# Patient Record
Sex: Female | Born: 1965
Health system: Southern US, Community
[De-identification: ages and names within clinical notes are randomized; demographics above are authoritative.]

## PROBLEM LIST (undated history)

## (undated) DIAGNOSIS — A159 Respiratory tuberculosis unspecified: Secondary | ICD-10-CM

## (undated) DIAGNOSIS — I10 Essential (primary) hypertension: Secondary | ICD-10-CM

## (undated) HISTORY — DX: Respiratory tuberculosis unspecified: A15.9

## (undated) HISTORY — PX: WISDOM TOOTH EXTRACTION: SHX21

## (undated) HISTORY — PX: SPINE SURGERY: SHX786

## (undated) HISTORY — DX: Essential (primary) hypertension: I10

## (undated) HISTORY — PX: BREAST SURGERY: SHX581

---

## 2015-10-06 DIAGNOSIS — Q7649 Other congenital malformations of spine, not associated with scoliosis: Secondary | ICD-10-CM | POA: Insufficient documentation

## 2015-10-06 DIAGNOSIS — M5416 Radiculopathy, lumbar region: Secondary | ICD-10-CM | POA: Insufficient documentation

## 2015-10-27 DIAGNOSIS — M47816 Spondylosis without myelopathy or radiculopathy, lumbar region: Secondary | ICD-10-CM | POA: Insufficient documentation

## 2016-02-24 DIAGNOSIS — M533 Sacrococcygeal disorders, not elsewhere classified: Secondary | ICD-10-CM | POA: Insufficient documentation

## 2018-09-06 ENCOUNTER — Other Ambulatory Visit: Payer: Self-pay

## 2018-09-06 DIAGNOSIS — Z20822 Contact with and (suspected) exposure to covid-19: Secondary | ICD-10-CM

## 2018-09-07 LAB — NOVEL CORONAVIRUS, NAA: SARS-CoV-2, NAA: NOT DETECTED

## 2019-03-10 ENCOUNTER — Encounter: Payer: Self-pay | Admitting: Family Medicine

## 2019-03-10 ENCOUNTER — Encounter: Payer: Self-pay | Admitting: Gastroenterology

## 2019-03-10 ENCOUNTER — Ambulatory Visit: Payer: 59 | Admitting: Family Medicine

## 2019-03-10 ENCOUNTER — Other Ambulatory Visit: Payer: Self-pay

## 2019-03-10 VITALS — BP 128/87 | HR 84 | Temp 97.3°F | Resp 17 | Ht 61.5 in | Wt 148.8 lb

## 2019-03-10 DIAGNOSIS — N912 Amenorrhea, unspecified: Secondary | ICD-10-CM

## 2019-03-10 DIAGNOSIS — F338 Other recurrent depressive disorders: Secondary | ICD-10-CM

## 2019-03-10 DIAGNOSIS — Z13220 Encounter for screening for lipoid disorders: Secondary | ICD-10-CM | POA: Diagnosis not present

## 2019-03-10 DIAGNOSIS — Z114 Encounter for screening for human immunodeficiency virus [HIV]: Secondary | ICD-10-CM | POA: Diagnosis not present

## 2019-03-10 DIAGNOSIS — Z1211 Encounter for screening for malignant neoplasm of colon: Secondary | ICD-10-CM | POA: Diagnosis not present

## 2019-03-10 DIAGNOSIS — R252 Cramp and spasm: Secondary | ICD-10-CM

## 2019-03-10 DIAGNOSIS — I10 Essential (primary) hypertension: Secondary | ICD-10-CM

## 2019-03-10 DIAGNOSIS — Z136 Encounter for screening for cardiovascular disorders: Secondary | ICD-10-CM

## 2019-03-10 NOTE — Patient Instructions (Addendum)
If you have lab work done today you will be contacted with your lab results within the next 2 weeks.  If you have not heard from [place] then please contact us. The fastest way to get your results is to register for My Chart.   IF you received an x-ray today, you will receive an invoice from [facility]. Please contact [facility] at [phone] with questions or concerns regarding your invoice.   IF you received labwork today, you will receive an invoice from [facility]. Please contact [facility] at [phone] with questions or concerns regarding your invoice.   Our billing staff will not be able to assist you with questions regarding bills from these companies.  You will be contacted with the lab results as soon as they are available. The fastest way to get your results is to activate your My Chart account. Instructions are located on the last page of this paperwork. If you have not heard from [place] regarding the results in 2 weeks, please contact this office.     Colorectal Cancer Screening  Colorectal cancer screening is a group of tests that are used to check for colorectal cancer before symptoms develop. Colorectal refers to the colon and rectum. The colon and rectum are located at the end of the digestive tract and carry bowel movements out of the body. Who should have screening? All adults starting at age 58 until age 8 should have screening. Your health care provider may recommend screening at age 33. You will have tests every 1-10 years, depending on your results and the type of screening test. You may have screening tests starting at an earlier age, or more frequently than other people, if you have any of the following risk factors:  A personal or family history of colorectal cancer or abnormal growths (polyps).  Inflammatory bowel disease, such as ulcerative colitis or Crohn's disease.  A history of having radiation treatment to the abdomen or pelvic area for  cancer.  Colorectal cancer symptoms, such as changes in bowel habits or blood in your stool.  A type of colon cancer syndrome that is passed from parent to child (hereditary), such as: ? Lynch syndrome. ? Familial adenomatous polyposis. ? Turcot syndrome. ? Peutz-Jeghers syndrome. Screening recommendations for adults who are 76-86 years old vary depending on health. How is screening done? There are several types of colorectal screening tests. You may have one or more of the following:  Guaiac-based fecal occult blood testing. For this test, a stool (feces) sample is checked for hidden (occult) blood, which could be a sign of colorectal cancer.  Fecal immunochemical test (FIT). For this test, a stool sample is checked for blood, which could be a sign of colorectal cancer.  Stool DNA test. For this test, a stool sample is checked for blood and changes in DNA that could lead to colorectal cancer.  Sigmoidoscopy. During this test, a thin, flexible tube with a camera on the end (sigmoidoscope) is used to examine the rectum and the lower colon.  Colonoscopy. During this test, a long, flexible tube with a camera on the end (colonoscope) is used to examine the entire colon and rectum. With a colonoscopy, it is possible to take a sample of tissue (biopsy) and remove small polyps during the test.  Virtual colonoscopy. Instead of a colonoscope, this type of colonoscopy uses X-rays (CT scan) and computers to produce images of the colon and rectum. What are the benefits of screening? Screening reduces your risk for colorectal  cancer and can help identify cancer at an early stage, when the cancer can be removed or treated more easily. It is common for polyps to form in the lining of the colon, especially as you age. These polyps may be cancerous or become cancerous over time. Screening can identify these polyps. What are the risks of screening? Each screening test may have different risks.  Stool  sample tests have fewer risks than other types of screening tests. However, you may need more tests to confirm results from a stool sample test.  Screening tests that involve X-rays expose you to low levels of radiation, which may slightly increase your cancer risk. The benefit of detecting cancer outweighs the slight increase in risk.  Screening tests such as sigmoidoscopy and colonoscopy may place you at risk for bleeding, intestinal damage, infection, or a reaction to medicines given during the exam. Talk with your health care provider to understand your risk for colorectal cancer and to make a screening plan that is right for you. Questions to ask your health care provider  When should I start colorectal cancer screening?  What is my risk for colorectal cancer?  How often do I need screening?  Which screening tests do I need?  How do I get my test results?  What do my results mean? Where to find more information Learn more about colorectal cancer screening from:  The [employer]: www.cancer.org  The [employer]: www.cancer.gov Summary  Colorectal cancer screening is a group of tests used to check for colorectal cancer before symptoms develop.  Screening reduces your risk for colorectal cancer and can help identify cancer at an early stage, when the cancer can be removed or treated more easily.  All adults starting at age 62 until age 14 should have screening. Your health care provider may recommend screening at age 35.  You may have screening tests starting at an earlier age, or more frequently than other people, if you have certain risk factors.  Talk with your health care provider to understand your risk for colorectal cancer and to make a screening plan that is right for you. This information is not intended to replace advice given to you by your health care provider. Make sure you discuss any questions you have with your health care  provider. Document Revised: 04/30/2018 Document Reviewed: 10/10/2016 Elsevier Patient Education  [place].

## 2019-03-10 NOTE — Progress Notes (Signed)
Established Patient Office Visit  Subjective:  Patient ID: Tracey Church, female    DOB: 22-Apr-1965  Age: 53 y.o. MRN: [id number]  CC:  Chief Complaint  Patient presents with  . New Patient (Initial Visit)    establish care.  Pt has concerns about weight/height ratio and she is fine in the summer but the winter months she can't control her weight and eating.  Pt requesting referral for colonoscopy, pt has gyn and mamm scheduled.  Pt on amlodipine 10 mg for bp.  Pt was on depo but discontinued due to bp, last lmp in 2019-2017 per pt.  No refill needed on medication at this moment    HPI Tracey Church presents for   Seasonal Affective Disorder Patient reports that in the summer time she is active and can control her weight She states that she suffers from seasonal affective disorder She reports that she is find in the summer by the winter she feels sad Depression screen [facility] 2/9 03/10/2019  Decreased Interest 0  Down, Depressed, Hopeless 0  PHQ - 2 Score 0     Colon Cancer Screening She has never had a colonoscopy she denies blood in his stool, unexpected weight loss or pain with defecation No rectal itching she does not smoke she does not have a family history of colon cancer  Hypertension  She was previously on Depo Provera She states that she stopped the Depo because of her blood pressure and her last lmp in 2019. She reports that she avoid extra salt as well Since switching contraception she still needed meds for hypertension  Her bp is well controlled with the Amlodipine She denies chest pain, sob, no lower extremity edema    Past Medical History:  Diagnosis Date  . Hypertension     Past Surgical History:  Procedure Laterality Date  . BREAST SURGERY    . SPINE SURGERY      Family History  Problem Relation Age of Onset  . Hypertension Mother   . Hypertension Father   . Cancer Brother     Social History   Socioeconomic History  . Marital status:  Unknown    Spouse name: Not on file  . Number of children: Not on file  . Years of education: Not on file  . Highest education level: Not on file  Occupational History  . Not on file  Tobacco Use  . Smoking status: Never Smoker  . Smokeless tobacco: Never Used  Substance and Sexual Activity  . Alcohol use: Never  . Drug use: Never  . Sexual activity: Not on file  Other Topics Concern  . Not on file  Social History Narrative  . Not on file   Social Determinants of Health   Financial Resource Strain:   . Difficulty of Paying Living Expenses: Not on file  Food Insecurity:   . Worried About Charity fundraiser in the Last Year: Not on file  . Ran Out of Food in the Last Year: Not on file  Transportation Needs:   . Lack of Transportation (Medical): Not on file  . Lack of Transportation (Non-Medical): Not on file  Physical Activity:   . Days of Exercise per Week: Not on file  . Minutes of Exercise per Session: Not on file  Stress:   . Feeling of Stress : Not on file  Social Connections:   . Frequency of Communication with Friends and Family: Not on file  . Frequency of Social Gatherings with Friends and  Family: Not on file  . Attends Religious Services: Not on file  . Active Member of Clubs or Organizations: Not on file  . Attends Archivist Meetings: Not on file  . Marital Status: Not on file  Intimate Partner Violence:   . Fear of Current or Ex-Partner: Not on file  . Emotionally Abused: Not on file  . Physically Abused: Not on file  . Sexually Abused: Not on file    Outpatient Medications Prior to Visit  Medication Sig Dispense Refill  . amLODipine (NORVASC) 10 MG tablet Take 10 mg by mouth daily.     No facility-administered medications prior to visit.    Allergies  Allergen Reactions  . Tramadol Other (See Comments)    Tremors    ROS Review of Systems Review of Systems  Constitutional: Negative for activity change, appetite change, chills and  fever.  HENT: Negative for congestion, nosebleeds, trouble swallowing and voice change.   Respiratory: Negative for cough, shortness of breath and wheezing.   Gastrointestinal: Negative for diarrhea, nausea and vomiting.  Genitourinary: Negative for difficulty urinating, dysuria, flank pain and hematuria.  Musculoskeletal: Negative for back pain, joint swelling and neck pain.  Neurological: Negative for dizziness, speech difficulty, light-headedness and numbness.  See HPI. All other review of systems negative.     Objective:    Physical Exam  BP 128/87 (BP Location: Left Arm, Patient Position: Sitting, Cuff Size: Normal)   Pulse 84   Temp (!) 97.3 F (36.3 C) (Oral)   Resp 17   Ht 5' 1.5" (1.562 m)   Wt 148 lb 12.8 oz (67.5 kg)   LMP  (LMP Unknown) Comment: discontinue depo and no menses since   SpO2 95%   BMI 27.66 kg/m  Wt Readings from Last 3 Encounters:  03/10/19 148 lb 12.8 oz (67.5 kg)   Physical Exam  Constitutional: Oriented to person, place, and time. Appears well-developed and well-nourished.  HENT:  Head: Normocephalic and atraumatic.  Eyes: Conjunctivae and EOM are normal.  Neck: supple, no thyromegaly Cardiovascular: Normal rate, regular rhythm, normal heart sounds and intact distal pulses.  No murmur heard. Pulmonary/Chest: Effort normal and breath sounds normal. No stridor. No respiratory distress. Has no wheezes.  Abdomen: non-distended, normoactive bs, soft, nontender Neurological: Is alert and oriented to person, place, and time.  Skin: Skin is warm. Capillary refill takes less than 2 seconds.  Psychiatric: Has a normal mood and affect. Behavior is normal. Judgment and thought content normal.    Health Maintenance Due  Topic Date Due  . HIV Screening  01/22/1982  . TETANUS/TDAP  01/22/1986  . PAP SMEAR-Modifier  01/23/1988  . MAMMOGRAM  01/22/2017  . COLONOSCOPY  01/22/2017  . INFLUENZA VACCINE  08/23/2018    There are no preventive care  reminders to display for this patient.  No results found for: TSH No results found for: WBC, HGB, HCT, MCV, PLT No results found for: NA, K, CHLORIDE, CO2, GLUCOSE, BUN, CREATININE, BILITOT, ALKPHOS, AST, ALT, PROT, ALBUMIN, CALCIUM, ANIONGAP, EGFR, GFR No results found for: CHOL No results found for: HDL No results found for: LDLCALC No results found for: TRIG No results found for: CHOLHDL No results found for: HGBA1C    Assessment & Plan:   Problem List Items Addressed This Visit    None    Visit Diagnoses    Essential hypertension    -  Primary Patient's blood pressure is at goal of 139/89 or less. Condition is stable. Continue current  medications and treatment plan. I recommend that you exercise for 30-45 minutes 5 days a week. I also recommend a balanced diet with fruits and vegetables every day, lean meats, and little fried foods. The DASH diet (you can find this online) is a good example of this.    Relevant Medications   amLODipine (NORVASC) 10 MG tablet   Other Relevant Orders   CMP14+EGFR   Microalbumin, urine   Lipid panel   Screening for HIV (human immunodeficiency virus)       Relevant Orders   HIV antibody (with reflex)   Screening for colon cancer    - -discussed options for colon cancer screenings, reviewed family history, discussed options for testing.  -pt decided to proceed with colonoscopy    Relevant Orders   Ambulatory referral to Gastroenterology   Amenorrhea    -  Pt had IUD Mirena placed in 10/2014 (see careeverywhere)  However she was perimenopausal before it was placed so will check LH/FSH   Relevant Orders   FSH/LH   Muscle cramps    -  Will check ferritin as well as electrolytes   Relevant Orders   Ferritin   Encounter for lipid screening for cardiovascular disease       Relevant Orders   Lipid panel      No orders of the defined types were placed in this encounter.   Follow-up: No follow-ups on file.    Forrest Moron, MD

## 2019-03-11 LAB — CMP14+EGFR
ALT: 16 IU/L (ref 0–32)
AST: 27 IU/L (ref 0–40)
Albumin/Globulin Ratio: 1 — ABNORMAL LOW (ref 1.2–2.2)
Albumin: 3.9 g/dL (ref 3.8–4.9)
Alkaline Phosphatase: 58 IU/L (ref 39–117)
BUN/Creatinine Ratio: 12 (ref 9–23)
BUN: 9 mg/dL (ref 6–24)
Bilirubin Total: 0.4 mg/dL (ref 0.0–1.2)
CO2: 23 mmol/L (ref 20–29)
Calcium: 9.7 mg/dL (ref 8.7–10.2)
Chloride: 103 mmol/L (ref 96–106)
Creatinine, Ser: 0.78 mg/dL (ref 0.57–1.00)
GFR calc Af Amer: 101 mL/min/{1.73_m2} (ref >59)
GFR calc non Af Amer: 88 mL/min/{1.73_m2} (ref >59)
Globulin, Total: 3.9 g/dL (ref 1.5–4.5)
Glucose: 99 mg/dL (ref 65–99)
Potassium: 4.9 mmol/L (ref 3.5–5.2)
Sodium: 139 mmol/L (ref 134–144)
Total Protein: 7.8 g/dL (ref 6.0–8.5)

## 2019-03-11 LAB — HIV ANTIBODY (ROUTINE TESTING W REFLEX): HIV Screen 4th Generation wRfx: NONREACTIVE

## 2019-03-11 LAB — LIPID PANEL
Chol/HDL Ratio: 2.9 ratio (ref 0.0–4.4)
Cholesterol, Total: 165 mg/dL (ref 100–199)
HDL: 57 mg/dL (ref >39)
LDL Chol Calc (NIH): 91 mg/dL (ref 0–99)
Triglycerides: 95 mg/dL (ref 0–149)
VLDL Cholesterol Cal: 17 mg/dL (ref 5–40)

## 2019-03-11 LAB — MICROALBUMIN, URINE: Microalbumin, Urine: 22.9 ug/mL

## 2019-03-11 LAB — FERRITIN: Ferritin: 156 ng/mL — ABNORMAL HIGH (ref 15–150)

## 2019-03-11 LAB — FSH/LH
FSH: 137 m[IU]/mL
LH: 65.6 m[IU]/mL

## 2019-03-21 DIAGNOSIS — H524 Presbyopia: Secondary | ICD-10-CM | POA: Diagnosis not present

## 2019-04-01 DIAGNOSIS — Z1239 Encounter for other screening for malignant neoplasm of breast: Secondary | ICD-10-CM | POA: Diagnosis not present

## 2019-04-01 DIAGNOSIS — Z1231 Encounter for screening mammogram for malignant neoplasm of breast: Secondary | ICD-10-CM | POA: Diagnosis not present

## 2019-04-01 DIAGNOSIS — E049 Nontoxic goiter, unspecified: Secondary | ICD-10-CM | POA: Diagnosis not present

## 2019-04-01 DIAGNOSIS — N760 Acute vaginitis: Secondary | ICD-10-CM | POA: Diagnosis not present

## 2019-04-01 DIAGNOSIS — Z13228 Encounter for screening for other metabolic disorders: Secondary | ICD-10-CM | POA: Diagnosis not present

## 2019-04-01 DIAGNOSIS — Z124 Encounter for screening for malignant neoplasm of cervix: Secondary | ICD-10-CM | POA: Diagnosis not present

## 2019-04-01 DIAGNOSIS — Z975 Presence of (intrauterine) contraceptive device: Secondary | ICD-10-CM | POA: Diagnosis not present

## 2019-04-01 DIAGNOSIS — Z01419 Encounter for gynecological examination (general) (routine) without abnormal findings: Secondary | ICD-10-CM | POA: Diagnosis not present

## 2019-04-01 DIAGNOSIS — R87810 Cervical high risk human papillomavirus (HPV) DNA test positive: Secondary | ICD-10-CM | POA: Diagnosis not present

## 2019-04-01 MED FILL — TINIDAZOLE 500 MG TABS: 500 | 2 days supply | Qty: 8 | Fill #0

## 2019-04-02 MED FILL — VIT D3-50 50,000 UNITS CAPS: 1.25 MG | 56 days supply | Qty: 8 | Fill #0

## 2019-04-15 ENCOUNTER — Other Ambulatory Visit: Payer: Self-pay | Admitting: Obstetrics and Gynecology

## 2019-04-15 DIAGNOSIS — E049 Nontoxic goiter, unspecified: Secondary | ICD-10-CM

## 2019-04-17 ENCOUNTER — Ambulatory Visit (AMBULATORY_SURGERY_CENTER): Payer: Self-pay

## 2019-04-17 ENCOUNTER — Other Ambulatory Visit: Payer: Self-pay | Admitting: Obstetrics and Gynecology

## 2019-04-17 ENCOUNTER — Other Ambulatory Visit: Payer: Self-pay

## 2019-04-17 VITALS — Temp 97.3°F | Ht 61.5 in | Wt 158.3 lb

## 2019-04-17 DIAGNOSIS — Z01818 Encounter for other preprocedural examination: Secondary | ICD-10-CM

## 2019-04-17 DIAGNOSIS — Z1211 Encounter for screening for malignant neoplasm of colon: Secondary | ICD-10-CM

## 2019-04-17 MED ORDER — NA SULFATE-K SULFATE-MG SULF 17.5-3.13-1.6 GM/177ML PO SOLN: 1.0000 | Freq: Once | ORAL | 0 refills | Status: AC

## 2019-04-17 MED FILL — SUPREP BOWEL PREP KIT: 17.5-3.13-1 | 1 days supply | Qty: 354 | Fill #0

## 2019-04-17 NOTE — Progress Notes (Signed)
No allergies to soy or egg Pt is not on blood thinners or diet pills Denies issues with sedation/intubation Denies atrial flutter/fib Denies constipation   Emmi instructions given to pt  Pt is aware of Covid safety and care partner requirements.  

## 2019-04-20 ENCOUNTER — Ambulatory Visit: Payer: Self-pay | Admitting: Family Medicine

## 2019-04-21 ENCOUNTER — Other Ambulatory Visit: Payer: 59

## 2019-04-24 ENCOUNTER — Ambulatory Visit
Admission: RE | Admit: 2019-04-24 | Discharge: 2019-04-24 | Disposition: A | Discharge status: Home / Self Care | Payer: 59 | Source: Ambulatory Visit | Attending: Obstetrics and Gynecology | Admitting: Obstetrics and Gynecology

## 2019-04-24 DIAGNOSIS — E049 Nontoxic goiter, unspecified: Secondary | ICD-10-CM

## 2019-04-29 ENCOUNTER — Ambulatory Visit (INDEPENDENT_AMBULATORY_CARE_PROVIDER_SITE_OTHER): Payer: 59

## 2019-04-29 ENCOUNTER — Other Ambulatory Visit: Payer: Self-pay | Admitting: Gastroenterology

## 2019-04-29 DIAGNOSIS — Z1159 Encounter for screening for other viral diseases: Secondary | ICD-10-CM

## 2019-04-30 LAB — SARS CORONAVIRUS 2 (TAT 6-24 HRS): SARS Coronavirus 2: NEGATIVE

## 2019-05-01 ENCOUNTER — Encounter: Payer: Self-pay | Admitting: Gastroenterology

## 2019-05-01 ENCOUNTER — Ambulatory Visit (AMBULATORY_SURGERY_CENTER): Payer: 59 | Admitting: Gastroenterology

## 2019-05-01 ENCOUNTER — Other Ambulatory Visit: Payer: Self-pay

## 2019-05-01 VITALS — BP 116/77 | HR 68 | Temp 96.6°F | Resp 13 | Ht 61.5 in | Wt 158.3 lb

## 2019-05-01 DIAGNOSIS — Z1211 Encounter for screening for malignant neoplasm of colon: Secondary | ICD-10-CM | POA: Diagnosis not present

## 2019-05-01 DIAGNOSIS — D120 Benign neoplasm of cecum: Secondary | ICD-10-CM

## 2019-05-01 DIAGNOSIS — K635 Polyp of colon: Secondary | ICD-10-CM | POA: Diagnosis not present

## 2019-05-01 DIAGNOSIS — K621 Rectal polyp: Secondary | ICD-10-CM

## 2019-05-01 DIAGNOSIS — D128 Benign neoplasm of rectum: Secondary | ICD-10-CM

## 2019-05-01 MED ORDER — SODIUM CHLORIDE 0.9 % IV SOLN: 500.0000 mL | Freq: Once | INTRAVENOUS | Status: DC

## 2019-05-01 NOTE — Progress Notes (Signed)
To PACU, VSS. Report to [email or web address] 

## 2019-05-01 NOTE — Patient Instructions (Signed)
YOU HAD AN ENDOSCOPIC PROCEDURE TODAY AT THE [facility]:   Refer to the procedure report that was given to you for any specific questions about what was found during the examination.  If the procedure report does not answer your questions, please call your gastroenterologist to clarify.  If you requested that your care partner not be given the details of your procedure findings, then the procedure report has been included in a sealed envelope for you to review at your convenience later.  YOU SHOULD EXPECT: Some feelings of bloating in the abdomen. Passage of more gas than usual.  Walking can help get rid of the air that was put into your GI tract during the procedure and reduce the bloating. If you had a lower endoscopy (such as a colonoscopy or flexible sigmoidoscopy) you may notice spotting of blood in your stool or on the toilet paper. If you underwent a bowel prep for your procedure, you may not have a normal bowel movement for a few days.  Please Note:  You might notice some irritation and congestion in your nose or some drainage.  This is from the oxygen used during your procedure.  There is no need for concern and it should clear up in a day or so.  SYMPTOMS TO REPORT IMMEDIATELY:   Following lower endoscopy (colonoscopy or flexible sigmoidoscopy):  Excessive amounts of blood in the stool  Significant tenderness or worsening of abdominal pains  Swelling of the abdomen that is new, acute  Fever of 100F or higher  For urgent or emergent issues, a gastroenterologist can be reached at any hour by calling [phone]. Do not use MyChart messaging for urgent concerns.    DIET:  We do recommend a small meal at first, but then you may proceed to your regular diet.  Drink plenty of fluids but you should avoid alcoholic beverages for 24 hours.  ACTIVITY:  You should plan to take it easy for the rest of today and you should NOT DRIVE or use heavy machinery until tomorrow (because  of the sedation medicines used during the test).    FOLLOW UP: Our staff will call the number listed on your records 48-72 hours following your procedure to check on you and address any questions or concerns that you may have regarding the information given to you following your procedure. If we do not reach you, we will leave a message.  We will attempt to reach you two times.  During this call, we will ask if you have developed any symptoms of COVID 19. If you develop any symptoms (ie: fever, flu-like symptoms, shortness of breath, cough etc.) before then, please call [phone].  If you test positive for Covid 19 in the 2 weeks post procedure, please call and report this information to us.    If any biopsies were taken you will be contacted by phone or by letter within the next 1-3 weeks.  Please call us at [phone] if you have not heard about the biopsies in 3 weeks.    SIGNATURES/CONFIDENTIALITY: You and/or your care partner have signed paperwork which will be entered into your electronic medical record.  These signatures attest to the fact that that the information above on your After Visit Summary has been reviewed and is understood.  Full responsibility of the confidentiality of this discharge information lies with you and/or your care-partner. 

## 2019-05-01 NOTE — Progress Notes (Signed)
Temp by JB Vitals by CW  Pt's states no medical or surgical changes since previsit or office visit.  

## 2019-05-01 NOTE — Op Note (Signed)
[facility] Patient Name: Tracey Church Procedure Date: 05/01/2019 7:14 AM MRN: [id number] Endoscopist: Mauri Pole , MD Age: 54 Referring MD:  Date of Birth: 02/08/1965 Gender: Female Account #: [id number] Procedure:                Colonoscopy Indications:              Screening for colorectal malignant neoplasm Medicines:                Monitored Anesthesia Care Procedure:                Pre-Anesthesia Assessment:                           - Prior to the procedure, a History and Physical                            was performed, and patient medications and                            allergies were reviewed. The patient's tolerance of                            previous anesthesia was also reviewed. The risks                            and benefits of the procedure and the sedation                            options and risks were discussed with the patient.                            All questions were answered, and informed consent                            was obtained. Prior Anticoagulants: The patient has                            taken no previous anticoagulant or antiplatelet                            agents. ASA Grade Assessment: II - A patient with                            mild systemic disease. After reviewing the risks                            and benefits, the patient was deemed in                            satisfactory condition to undergo the procedure.                           After obtaining informed consent, the colonoscope  was passed under direct vision. Throughout the                            procedure, the patient's blood pressure, pulse, and                            oxygen saturations were monitored continuously. The                            Colonoscope was introduced through the anus and                            advanced to the the cecum, identified by                            appendiceal orifice and  ileocecal valve. The                            colonoscopy was performed without difficulty. The                            patient tolerated the procedure well. The quality                            of the bowel preparation was excellent. The                            ileocecal valve, appendiceal orifice, and rectum                            were photographed. Scope In: 8:10:45 AM Scope Out: 8:26:43 AM Scope Withdrawal Time: 0 hours 11 minutes 38 seconds  Total Procedure Duration: 0 hours 15 minutes 58 seconds  Findings:                 The perianal and digital rectal examinations were                            normal.                           Three sessile polyps were found in the rectum and                            appendiceal orifice. The polyps were 1 to 2 mm in                            size. These polyps were removed with a cold biopsy                            forceps. Resection and retrieval were complete.                           Non-bleeding internal hemorrhoids were found during  retroflexion. The hemorrhoids were small.                           The exam was otherwise without abnormality. Complications:            No immediate complications. Estimated Blood Loss:     Estimated blood loss was minimal. Impression:               - Three 1 to 2 mm polyps in the rectum and at the                            appendiceal orifice, removed with a cold biopsy                            forceps. Resected and retrieved.                           - Non-bleeding internal hemorrhoids.                           - The examination was otherwise normal. Recommendation:           - Patient has a contact number available for                            emergencies. The signs and symptoms of potential                            delayed complications were discussed with the                            patient. Return to normal activities tomorrow.                             Written discharge instructions were provided to the                            patient.                           - Resume previous diet.                           - Continue present medications.                           - Await pathology results.                           - Repeat colonoscopy in 5-10 years for surveillance                            based on pathology results. Mauri Pole, MD 05/01/2019 8:31:45 AM This report has been signed electronically.

## 2019-05-01 NOTE — Progress Notes (Signed)
Called to room to assist during endoscopic procedure.  Patient ID and intended procedure confirmed with present staff. Received instructions for my participation in the procedure from the performing physician.  

## 2019-05-05 ENCOUNTER — Telehealth: Payer: Self-pay

## 2019-05-05 NOTE — Telephone Encounter (Signed)
First post procedure follow up call, no answer 

## 2019-05-05 NOTE — Telephone Encounter (Signed)
No answer, left message to call if having any issues or concerns, B.Tiaunna Buford RN 

## 2019-05-06 DIAGNOSIS — B977 Papillomavirus as the cause of diseases classified elsewhere: Secondary | ICD-10-CM | POA: Diagnosis not present

## 2019-05-06 DIAGNOSIS — Z09 Encounter for follow-up examination after completed treatment for conditions other than malignant neoplasm: Secondary | ICD-10-CM | POA: Diagnosis not present

## 2019-05-12 ENCOUNTER — Encounter: Payer: Self-pay | Admitting: Gastroenterology

## 2019-05-19 ENCOUNTER — Other Ambulatory Visit: Payer: Self-pay

## 2019-05-19 ENCOUNTER — Encounter: Payer: Self-pay | Admitting: Registered Nurse

## 2019-05-19 ENCOUNTER — Ambulatory Visit: Payer: 59 | Admitting: Registered Nurse

## 2019-05-19 VITALS — BP 109/75 | HR 87 | Temp 98.3°F | Resp 16 | Ht 61.5 in | Wt 157.0 lb

## 2019-05-19 DIAGNOSIS — I10 Essential (primary) hypertension: Secondary | ICD-10-CM

## 2019-05-19 DIAGNOSIS — R0600 Dyspnea, unspecified: Secondary | ICD-10-CM | POA: Diagnosis not present

## 2019-05-19 DIAGNOSIS — R635 Abnormal weight gain: Secondary | ICD-10-CM

## 2019-05-19 DIAGNOSIS — R0609 Other forms of dyspnea: Secondary | ICD-10-CM

## 2019-05-19 DIAGNOSIS — M7989 Other specified soft tissue disorders: Secondary | ICD-10-CM | POA: Diagnosis not present

## 2019-05-19 MED ORDER — FUROSEMIDE 20 MG PO TABS: 20.0000 mg | ORAL_TABLET | Freq: Every day | ORAL | 3 refills | Status: DC

## 2019-05-19 MED ORDER — HYDROCHLOROTHIAZIDE 25 MG PO TABS: 25.0000 mg | ORAL_TABLET | Freq: Every day | ORAL | 3 refills | Status: DC

## 2019-05-19 MED ORDER — BUPROPION HCL ER (SR) 100 MG PO TB12: 100.0000 mg | ORAL_TABLET | Freq: Two times a day (BID) | ORAL | 0 refills | Status: DC

## 2019-05-19 MED FILL — buPROPion HCL ER (SR) 100 M: 100 | 90 days supply | Qty: 180 | Fill #0

## 2019-05-19 NOTE — Patient Instructions (Signed)
° ° ° °  If you have lab work done today you will be contacted with your lab results within the next 2 weeks.  If you have not heard from us then please contact us. The fastest way to get your results is to register for My Chart. ° ° °IF you received an x-ray today, you will receive an invoice from [facility]. Please contact [facility] at [phone] with questions or concerns regarding your invoice.  ° °IF you received labwork today, you will receive an invoice from [facility]. Please contact [facility] at [phone] with questions or concerns regarding your invoice.  ° °Our billing staff will not be able to assist you with questions regarding bills from these companies. ° °You will be contacted with the lab results as soon as they are available. The fastest way to get your results is to activate your My Chart account. Instructions are located on the last page of this paperwork. If you have not heard from us regarding the results in 2 weeks, please contact this office. °  ° ° ° °

## 2019-05-19 NOTE — Progress Notes (Signed)
Acute Office Visit  Subjective:    Patient ID: Tracey Church, female    DOB: 1965/08/26, 54 y.o.   MRN: [id number]  Chief Complaint  Patient presents with  . Leg Swelling    patient states from wednesday to saturday she had some ankle swelling and broke out into a rash on legs as well. per patient she used ice but it didnt work so she tried to stay off of it.    HPI Patient is in today for leg swelling and weight gain  Weight gain has been steady for a number of months - but it has been over 20lbs with no clear reason. Pt reports that her activity has been somewhat lessened, but diet has been steady. Feeling fatigued and out of breath on exertion very easily. No other changes to note Has been seen recently by PCP Dr. Nolon Rod and GYN, who both had run labs including CBC, tsh, cmp, a1c, lipid panel, fsh/lh, vit d, and more. No clear result. Has been started on Vit D supplementation with no effect. Legs have been swelling 1-2 weeks. Mild rash appearing. Waxes and wanes, overall improving. Has been on amlodipine for a number of years with no changes.   Past Medical History:  Diagnosis Date  . Hypertension   . Tuberculosis    tx in 1987.  Hx of neg chest x-rays    Past Surgical History:  Procedure Laterality Date  . BREAST SURGERY    . SPINE SURGERY    . WISDOM TOOTH EXTRACTION      Family History  Problem Relation Age of Onset  . Hypertension Mother   . Hypertension Father   . Cancer Brother   . Esophageal cancer Brother   . Colon cancer Neg Hx   . Colon polyps Neg Hx   . Rectal cancer Neg Hx   . Stomach cancer Neg Hx     Social History   Socioeconomic History  . Marital status: Soil scientist    Spouse name: Not on file  . Number of children: Not on file  . Years of education: Not on file  . Highest education level: Not on file  Occupational History  . Not on file  Tobacco Use  . Smoking status: Never Smoker  . Smokeless tobacco: Never Used  Substance and  Sexual Activity  . Alcohol use: Never  . Drug use: Never  . Sexual activity: Not on file  Other Topics Concern  . Not on file  Social History Narrative  . Not on file   Social Determinants of Health   Financial Resource Strain:   . Difficulty of Paying Living Expenses:   Food Insecurity:   . Worried About Charity fundraiser in the Last Year:   . Arboriculturist in the Last Year:   Transportation Needs:   . Film/video editor (Medical):   Marland Kitchen Lack of Transportation (Non-Medical):   Physical Activity:   . Days of Exercise per Week:   . Minutes of Exercise per Session:   Stress:   . Feeling of Stress :   Social Connections:   . Frequency of Communication with Friends and Family:   . Frequency of Social Gatherings with Friends and Family:   . Attends Religious Services:   . Active Member of Clubs or Organizations:   . Attends Archivist Meetings:   Marland Kitchen Marital Status:   Intimate Partner Violence:   . Fear of Current or Ex-Partner:   . Emotionally  Abused:   Marland Kitchen Physically Abused:   . Sexually Abused:     Outpatient Medications Prior to Visit  Medication Sig Dispense Refill  . acetaminophen (TYLENOL) 650 MG CR tablet Take by mouth.    . D3-50 1.25 MG (50000 UT) capsule Take 50,000 Units by mouth once a week.    Marland Kitchen amLODipine (NORVASC) 10 MG tablet Take 10 mg by mouth daily.     No facility-administered medications prior to visit.    Allergies  Allergen Reactions  . Tramadol Other (See Comments)    Tremors    Review of Systems Per hpi , all others negative    Objective:    Physical Exam Vitals and nursing note reviewed.  Constitutional:      General: She is not in acute distress.    Appearance: Normal appearance. She is normal weight. She is not toxic-appearing.  Cardiovascular:     Rate and Rhythm: Normal rate and regular rhythm.     Pulses: Normal pulses.     Heart sounds: Normal heart sounds. No murmur. No friction rub. No gallop.   Pulmonary:      Effort: Pulmonary effort is normal. No respiratory distress.     Breath sounds: Normal breath sounds. No stridor. No wheezing, rhonchi or rales.  Chest:     Chest wall: No tenderness.  Skin:    Capillary Refill: Capillary refill takes less than 2 seconds.  Neurological:     General: No focal deficit present.     Mental Status: She is alert and oriented to person, place, and time. Mental status is at baseline.  Psychiatric:        Mood and Affect: Mood normal.        Behavior: Behavior normal.        Thought Content: Thought content normal.        Judgment: Judgment normal.     BP 109/75   Pulse 87   Temp 98.3 F (36.8 C) (Temporal)   Resp 16   Ht 5' 1.5" (1.562 m)   Wt 157 lb (71.2 kg)   SpO2 97%   BMI 29.18 kg/m  Wt Readings from Last 3 Encounters:  05/19/19 157 lb (71.2 kg)  05/01/19 158 lb 4.8 oz (71.8 kg)  04/17/19 158 lb 4.8 oz (71.8 kg)    There are no preventive care reminders to display for this patient.  There are no preventive care reminders to display for this patient.   No results found for: TSH No results found for: WBC, HGB, HCT, MCV, PLT Lab Results  Component Value Date   NA 139 03/10/2019   K 4.9 03/10/2019   CO2 23 03/10/2019   GLUCOSE 99 03/10/2019   BUN 9 03/10/2019   CREATININE 0.78 03/10/2019   BILITOT 0.4 03/10/2019   ALKPHOS 58 03/10/2019   AST 27 03/10/2019   ALT 16 03/10/2019   PROT 7.8 03/10/2019   ALBUMIN 3.9 03/10/2019   CALCIUM 9.7 03/10/2019   Lab Results  Component Value Date   CHOL 165 03/10/2019   Lab Results  Component Value Date   HDL 57 03/10/2019   Lab Results  Component Value Date   LDLCALC 91 03/10/2019   Lab Results  Component Value Date   TRIG 95 03/10/2019   Lab Results  Component Value Date   CHOLHDL 2.9 03/10/2019   No results found for: HGBA1C     Assessment & Plan:   Problem List Items Addressed This Visit    None  Visit Diagnoses    Essential hypertension    -  Primary   Relevant  Medications   hydrochlorothiazide (HYDRODIURIL) 25 MG tablet   Rapid weight gain       Relevant Medications   buPROPion (WELLBUTRIN SR) 100 MG 12 hr tablet   Other Relevant Orders   Ambulatory referral to Endocrinology   Dyspnea on exertion       Relevant Orders   EKG 12-Lead (Completed)       Meds ordered this encounter  Medications  . hydrochlorothiazide (HYDRODIURIL) 25 MG tablet    Sig: Take 1 tablet (25 mg total) by mouth daily.    Dispense:  90 tablet    Refill:  3    Order Specific Question:   Supervising Provider    Answer:   Delia Chimes A [id number]  . buPROPion (WELLBUTRIN SR) 100 MG 12 hr tablet    Sig: Take 1 tablet (100 mg total) by mouth 2 (two) times daily.    Dispense:  180 tablet    Refill:  0    Order Specific Question:   Supervising Provider    Answer:   Forrest Moron [id number]   PLAN  ekg wnl  Reviewed past labs, wnl  Will refer to endo for further work up  Discussed diet and exercise, potential for referral to medical weight management if needed  Pt experiencing depressed mood as a result of weight, will start bupropion 100mg  12hr po qd and increase to bid after one week.  Lasix 20mg  PO qd prn for leg swelling  Patient encouraged to call clinic with any questions, comments, or concerns.   Maximiano Coss, NP

## 2019-05-19 NOTE — Progress Notes (Signed)
1 

## 2019-05-20 MED FILL — FUROSEMIDE 20 MG TABS: 20 | 30 days supply | Qty: 30 | Fill #0

## 2019-05-26 ENCOUNTER — Encounter: Payer: Self-pay | Admitting: Registered Nurse

## 2019-05-26 ENCOUNTER — Ambulatory Visit: Payer: 59 | Admitting: Registered Nurse

## 2019-05-26 ENCOUNTER — Other Ambulatory Visit: Payer: Self-pay

## 2019-05-26 VITALS — BP 120/87 | HR 84 | Temp 97.8°F | Resp 16 | Ht 61.5 in | Wt 156.6 lb

## 2019-05-26 DIAGNOSIS — L509 Urticaria, unspecified: Secondary | ICD-10-CM | POA: Diagnosis not present

## 2019-05-26 DIAGNOSIS — I10 Essential (primary) hypertension: Secondary | ICD-10-CM

## 2019-05-26 DIAGNOSIS — R609 Edema, unspecified: Secondary | ICD-10-CM

## 2019-05-26 MED ORDER — CHLORTHALIDONE 50 MG PO TABS: 50.0000 mg | ORAL_TABLET | Freq: Every day | ORAL | 0 refills | Status: DC

## 2019-05-26 MED ORDER — PREDNISONE 10 MG (21) PO TBPK: ORAL_TABLET | ORAL | 0 refills | Status: DC

## 2019-05-26 MED ORDER — TRIAMCINOLONE ACETONIDE 0.1 % EX CREA: 1.0000 "application " | TOPICAL_CREAM | Freq: Two times a day (BID) | CUTANEOUS | 0 refills | Status: DC

## 2019-05-26 MED ORDER — AMLODIPINE BESYLATE 10 MG PO TABS: 10.0000 mg | ORAL_TABLET | Freq: Every day | ORAL | 1 refills | Status: AC

## 2019-05-26 MED FILL — TRIAMCINOLONE 0.1% CREAM: 0.1 | 15 days supply | Qty: 30 | Fill #0

## 2019-05-26 MED FILL — CHLORTHALIDONE 50 MG TAB: 50 | 90 days supply | Qty: 90 | Fill #0

## 2019-05-26 MED FILL — AMLODIPINE BESYLATE 10 MG T: 10 | 90 days supply | Qty: 90 | Fill #0

## 2019-05-26 NOTE — Patient Instructions (Signed)
° ° ° °  If you have lab work done today you will be contacted with your lab results within the next 2 weeks.  If you have not heard from us then please contact us. The fastest way to get your results is to register for My Chart. ° ° °IF you received an x-ray today, you will receive an invoice from [facility]. Please contact [facility] at [phone] with questions or concerns regarding your invoice.  ° °IF you received labwork today, you will receive an invoice from [facility]. Please contact [facility] at [phone] with questions or concerns regarding your invoice.  ° °Our billing staff will not be able to assist you with questions regarding bills from these companies. ° °You will be contacted with the lab results as soon as they are available. The fastest way to get your results is to activate your My Chart account. Instructions are located on the last page of this paperwork. If you have not heard from us regarding the results in 2 weeks, please contact this office. °  ° ° ° °

## 2019-05-27 ENCOUNTER — Ambulatory Visit: Payer: 59

## 2019-05-27 MED FILL — predniSONE 10 MG TABS: 10 | 6 days supply | Qty: 21 | Fill #0

## 2019-06-12 DIAGNOSIS — Z30432 Encounter for removal of intrauterine contraceptive device: Secondary | ICD-10-CM | POA: Diagnosis not present

## 2019-06-12 DIAGNOSIS — Z30430 Encounter for insertion of intrauterine contraceptive device: Secondary | ICD-10-CM | POA: Diagnosis not present

## 2019-06-15 ENCOUNTER — Ambulatory Visit: Payer: 59 | Admitting: Endocrinology

## 2019-06-18 DIAGNOSIS — Z30431 Encounter for routine checking of intrauterine contraceptive device: Secondary | ICD-10-CM | POA: Diagnosis not present

## 2019-06-28 ENCOUNTER — Encounter: Payer: Self-pay | Admitting: Registered Nurse

## 2019-06-28 NOTE — Progress Notes (Signed)
Established Patient Office Visit  Subjective:  Patient ID: Tracey Church, female    DOB: December 17, 1965  Age: 54 y.o. MRN: [id number]  CC:  Chief Complaint  Patient presents with  . Follow-up    patient states since the last time she came swelling got worse. Per patient she traveled to [place] on the train and felt her skin itching and bumps on arms and the back of her neck.     HPI Tracey Church presents for follow up   Since last visit swelling has worsened and a disseminated red rash has appeared.   Swelling: still in both legs. Denies further CV symptoms including claudication, palpitations, chest pain, shob, doe, visual changes, and headaches.   Rash: worst on arms and neck. Red, small bumps. No concentration in flexural areas or otherwise. No history of infection or infestation. No contacts with rash. No other changes beside medication.  No other concerns at this time.   Past Medical History:  Diagnosis Date  . Hypertension   . Tuberculosis    tx in 1987.  Hx of neg chest x-rays    Past Surgical History:  Procedure Laterality Date  . BREAST SURGERY    . SPINE SURGERY    . WISDOM TOOTH EXTRACTION      Family History  Problem Relation Age of Onset  . Hypertension Mother   . Hypertension Father   . Cancer Brother   . Esophageal cancer Brother   . Colon cancer Neg Hx   . Colon polyps Neg Hx   . Rectal cancer Neg Hx   . Stomach cancer Neg Hx     Social History   Socioeconomic History  . Marital status: Soil scientist    Spouse name: Not on file  . Number of children: Not on file  . Years of education: Not on file  . Highest education level: Not on file  Occupational History  . Not on file  Tobacco Use  . Smoking status: Never Smoker  . Smokeless tobacco: Never Used  Substance and Sexual Activity  . Alcohol use: Never  . Drug use: Never  . Sexual activity: Not on file  Other Topics Concern  . Not on file  Social History Narrative  . Not on file    Social Determinants of Health   Financial Resource Strain:   . Difficulty of Paying Living Expenses:   Food Insecurity:   . Worried About Charity fundraiser in the Last Year:   . Arboriculturist in the Last Year:   Transportation Needs:   . Film/video editor (Medical):   Marland Kitchen Lack of Transportation (Non-Medical):   Physical Activity:   . Days of Exercise per Week:   . Minutes of Exercise per Session:   Stress:   . Feeling of Stress :   Social Connections:   . Frequency of Communication with Friends and Family:   . Frequency of Social Gatherings with Friends and Family:   . Attends Religious Services:   . Active Member of Clubs or Organizations:   . Attends Archivist Meetings:   Marland Kitchen Marital Status:   Intimate Partner Violence:   . Fear of Current or Ex-Partner:   . Emotionally Abused:   Marland Kitchen Physically Abused:   . Sexually Abused:     Outpatient Medications Prior to Visit  Medication Sig Dispense Refill  . acetaminophen (TYLENOL) 650 MG CR tablet Take by mouth.    Marland Kitchen buPROPion (WELLBUTRIN SR) 100 MG 12  hr tablet Take 1 tablet (100 mg total) by mouth 2 (two) times daily. 180 tablet 0  . D3-50 1.25 MG (50000 UT) capsule Take 50,000 Units by mouth once a week.    . furosemide (LASIX) 20 MG tablet Take 1 tablet (20 mg total) by mouth daily. 30 tablet 3   No facility-administered medications prior to visit.    Allergies  Allergen Reactions  . Tramadol Other (See Comments)    Tremors    ROS Review of Systems Per hpi   Objective:    Physical Exam  Constitutional: She is oriented to person, place, and time. She appears well-developed and well-nourished. No distress.  Cardiovascular: Normal rate, regular rhythm, normal heart sounds and intact distal pulses. Exam reveals no gallop and no friction rub.  No murmur heard. Pulmonary/Chest: Effort normal and breath sounds normal. No respiratory distress. She has no wheezes. She has no rales. She exhibits no tenderness.   Neurological: She is alert and oriented to person, place, and time.  Skin: Skin is warm and dry. Rash noted. She is not diaphoretic. No erythema. No pallor.  Psychiatric: She has a normal mood and affect. Her behavior is normal. Judgment and thought content normal.  Nursing note and vitals reviewed.   BP 120/87   Pulse 84   Temp 97.8 F (36.6 C) (Temporal)   Resp 16   Ht 5' 1.5" (1.562 m)   Wt 156 lb 9.6 oz (71 kg)   SpO2 97%   BMI 29.11 kg/m  Wt Readings from Last 3 Encounters:  05/26/19 156 lb 9.6 oz (71 kg)  05/19/19 157 lb (71.2 kg)  05/01/19 158 lb 4.8 oz (71.8 kg)     Health Maintenance Due  Topic Date Due  . PAP SMEAR-Modifier  Never done    There are no preventive care reminders to display for this patient.  No results found for: TSH No results found for: WBC, HGB, HCT, MCV, PLT Lab Results  Component Value Date   NA 139 03/10/2019   K 4.9 03/10/2019   CO2 23 03/10/2019   GLUCOSE 99 03/10/2019   BUN 9 03/10/2019   CREATININE 0.78 03/10/2019   BILITOT 0.4 03/10/2019   ALKPHOS 58 03/10/2019   AST 27 03/10/2019   ALT 16 03/10/2019   PROT 7.8 03/10/2019   ALBUMIN 3.9 03/10/2019   CALCIUM 9.7 03/10/2019   Lab Results  Component Value Date   CHOL 165 03/10/2019   Lab Results  Component Value Date   HDL 57 03/10/2019   Lab Results  Component Value Date   LDLCALC 91 03/10/2019   Lab Results  Component Value Date   TRIG 95 03/10/2019   Lab Results  Component Value Date   CHOLHDL 2.9 03/10/2019   No results found for: HGBA1C    Assessment & Plan:   Problem List Items Addressed This Visit    None    Visit Diagnoses    Hives    -  Primary   Relevant Medications   predniSONE (STERAPRED UNI-PAK 21 TAB) 10 MG (21) TBPK tablet   triamcinolone cream (KENALOG) 0.1 %   Fluid retention       Relevant Medications   chlorthalidone (HYGROTON) 50 MG tablet   Essential hypertension       Relevant Medications   amLODipine (NORVASC) 10 MG tablet    chlorthalidone (HYGROTON) 50 MG tablet      Meds ordered this encounter  Medications  . amLODipine (NORVASC) 10 MG tablet  Sig: Take 1 tablet (10 mg total) by mouth daily.    Dispense:  90 tablet    Refill:  1  . predniSONE (STERAPRED UNI-PAK 21 TAB) 10 MG (21) TBPK tablet    Sig: Take per package instructions. Do not skip doses. Finish entire pack.    Dispense:  1 each    Refill:  0    Order Specific Question:   Supervising Provider    Answer:   Delia Chimes A [id number]  . chlorthalidone (HYGROTON) 50 MG tablet    Sig: Take 1 tablet (50 mg total) by mouth daily.    Dispense:  90 tablet    Refill:  0    Order Specific Question:   Supervising Provider    Answer:   Delia Chimes A [id number]  . triamcinolone cream (KENALOG) 0.1 %    Sig: Apply 1 application topically 2 (two) times daily.    Dispense:  30 g    Refill:  0    Order Specific Question:   Supervising Provider    Answer:   Forrest Moron [id number]    Follow-up: No follow-ups on file.   PLAN  Continue amlodipine - I do not think this is the angioedema that may come with amlodipine use.   Increase chlorthalidone to 50mg  PO qd  sterapred and topical for rash - appears inflammatory, likely an exposure to an allergen  Patient encouraged to call clinic with any questions, comments, or concerns.  Maximiano Coss, NP

## 2019-06-30 ENCOUNTER — Ambulatory Visit: Payer: 59 | Admitting: Registered Nurse

## 2019-07-23 DIAGNOSIS — M9903 Segmental and somatic dysfunction of lumbar region: Secondary | ICD-10-CM | POA: Diagnosis not present

## 2019-07-23 DIAGNOSIS — M545 Low back pain: Secondary | ICD-10-CM | POA: Diagnosis not present

## 2019-07-23 DIAGNOSIS — M542 Cervicalgia: Secondary | ICD-10-CM | POA: Diagnosis not present

## 2019-07-23 DIAGNOSIS — M9901 Segmental and somatic dysfunction of cervical region: Secondary | ICD-10-CM | POA: Diagnosis not present

## 2019-07-23 DIAGNOSIS — M62838 Other muscle spasm: Secondary | ICD-10-CM | POA: Diagnosis not present

## 2019-07-23 DIAGNOSIS — M546 Pain in thoracic spine: Secondary | ICD-10-CM | POA: Diagnosis not present

## 2019-07-23 DIAGNOSIS — M9902 Segmental and somatic dysfunction of thoracic region: Secondary | ICD-10-CM | POA: Diagnosis not present

## 2019-07-23 DIAGNOSIS — M62830 Muscle spasm of back: Secondary | ICD-10-CM | POA: Diagnosis not present

## 2019-08-20 DIAGNOSIS — M9902 Segmental and somatic dysfunction of thoracic region: Secondary | ICD-10-CM | POA: Diagnosis not present

## 2019-08-20 DIAGNOSIS — M9903 Segmental and somatic dysfunction of lumbar region: Secondary | ICD-10-CM | POA: Diagnosis not present

## 2019-08-20 DIAGNOSIS — M546 Pain in thoracic spine: Secondary | ICD-10-CM | POA: Diagnosis not present

## 2019-08-20 DIAGNOSIS — M62830 Muscle spasm of back: Secondary | ICD-10-CM | POA: Diagnosis not present

## 2019-08-20 DIAGNOSIS — M62838 Other muscle spasm: Secondary | ICD-10-CM | POA: Diagnosis not present

## 2019-08-20 DIAGNOSIS — M545 Low back pain: Secondary | ICD-10-CM | POA: Diagnosis not present

## 2019-08-20 DIAGNOSIS — M9901 Segmental and somatic dysfunction of cervical region: Secondary | ICD-10-CM | POA: Diagnosis not present

## 2019-08-20 DIAGNOSIS — M542 Cervicalgia: Secondary | ICD-10-CM | POA: Diagnosis not present

## 2019-09-17 ENCOUNTER — Ambulatory Visit: Payer: Self-pay

## 2019-09-17 ENCOUNTER — Encounter: Payer: Self-pay | Admitting: Family Medicine

## 2019-09-17 ENCOUNTER — Ambulatory Visit (INDEPENDENT_AMBULATORY_CARE_PROVIDER_SITE_OTHER): Payer: 59 | Admitting: Family Medicine

## 2019-09-17 ENCOUNTER — Other Ambulatory Visit: Payer: Self-pay

## 2019-09-17 VITALS — BP 118/86 | HR 81 | Ht 61.5 in | Wt 154.5 lb

## 2019-09-17 DIAGNOSIS — M7661 Achilles tendinitis, right leg: Secondary | ICD-10-CM | POA: Diagnosis not present

## 2019-09-17 DIAGNOSIS — M722 Plantar fascial fibromatosis: Secondary | ICD-10-CM | POA: Diagnosis not present

## 2019-09-17 DIAGNOSIS — M79671 Pain in right foot: Secondary | ICD-10-CM | POA: Diagnosis not present

## 2019-09-17 MED ORDER — NITROGLYCERIN 0.2 MG/HR TD PT24
MEDICATED_PATCH | TRANSDERMAL | 1 refills | Status: DC
Start: 2019-09-17 — End: 2020-02-10

## 2019-09-17 NOTE — Progress Notes (Signed)
    Subjective:    CC: R heel pain  I, Tracey Church, LAT, ATC, am serving as scribe for Dr. Lynne Leader.  HPI: Pt is a 54 y/o female presenting w/ c/o R heel pain since Sunday w/ no known MOI.  She locates her pain to her R plantar heel radiating into her R Achilles. She denies any injury that might have caused this.  Pain worse with activity.  Aggravating factors: walking; weight bearing Treatments tried: Advil, Aleve  Pertinent review of Systems: No fevers or chills.  Relevant historical information: History lumbar radiculopathy   Objective:    Vitals:   09/17/19 1522  BP: 118/86  Pulse: 81  SpO2: 97%   General: Well Developed, well nourished, and in no acute distress.   MSK: Right foot normal-appearing Normal motion. Tender palpation plantar medial calcaneus and posterior aspect of calcaneus.  Pain with foot dorsiflexion. Antalgic gait.  Lab and Radiology Results Diagnostic Limited MSK Ultrasound of: Right foot Plantar fascia visualized.  Plantar fascia thickness measures 0.4 cm.  Small heel spur present.  No obvious tear. Achilles tendon insertion margin normal-appearing small amount of calcification in distal tendon insertion.  No significant retrocalcaneal bursitis present. Impression: Probable plantar fasciitis with calcaneal heel spur and small Achilles tendinitis with small heel spur.    Impression and Recommendations:    Assessment and Plan: 54 y.o. female with acute onset of plantar fasciitis and Achilles tendinitis.  Patient may have suffered a small partial tear that is not visible on ultrasound of her plantar fascia.  Plan for relative rest with cam walker boot nitroglycerin patch protocol eccentric exercises and recheck if not improving in the near future. Consider injection if needed..   Orders Placed This Encounter  Procedures  . [place] LIMITED JOINT SPACE STRUCTURES LOW RIGHT(NO LINKED CHARGES)    Order Specific Question:   Reason for Exam  (SYMPTOM  OR DIAGNOSIS REQUIRED)    Answer:   R heel pain    Order Specific Question:   Preferred imaging location?    Answer:   [facility]   Meds ordered this encounter  Medications  . nitroGLYCERIN (NITRODUR - DOSED IN MG/24 HR) 0.2 mg/hr patch    Sig: Apply 1/4 patch daily to tendon for tendonitis.    Dispense:  30 patch    Refill:  1    Discussed warning signs or symptoms. Please see discharge instructions. Patient expresses understanding.   The above documentation has been reviewed and is accurate and complete Lynne Leader, M.D.

## 2019-09-17 NOTE — Patient Instructions (Signed)
Thank you for coming in today. Use the cam walker boot as needed.  [facility] or [facility].  [address], [place], [place] [address]  Use nitro patches like we talked about.   Do the heel stretch.  Go from up to down slowly.   Gel heel cushion.   Nitroglycerin Protocol   Apply 1/4 nitroglycerin patch to affected area daily.  Change position of patch within the affected area every 24 hours.  You may experience a headache during the first 1-2 weeks of using the patch, these should subside.  If you experience headaches after beginning nitroglycerin patch treatment, you may take your preferred over the counter pain reliever.  Another side effect of the nitroglycerin patch is skin irritation or rash related to patch adhesive.  Please notify our office if you develop more severe headaches or rash, and stop the patch.  Tendon healing with nitroglycerin patch may require 12 to 24 weeks depending on the extent of injury.  Men should not use if taking Viagra, Cialis, or Levitra.   Do not use if you have migraines or rosacea.   Recheck in a few weeks if not getting better.

## 2019-09-18 ENCOUNTER — Encounter: Payer: Self-pay | Admitting: Physical Therapy

## 2019-09-18 ENCOUNTER — Telehealth: Payer: Self-pay | Admitting: Family Medicine

## 2019-09-18 MED ORDER — PREDNISONE 50 MG PO TABS: 50.0000 mg | ORAL_TABLET | Freq: Every day | ORAL | 0 refills | Status: DC

## 2019-09-18 MED FILL — predniSONE 50 MG TABS: 50 | 5 days supply | Qty: 5 | Fill #0

## 2019-09-18 NOTE — Telephone Encounter (Signed)
Nitroglycerin patches can absolutely cause headache.  Typically it is pretty well manageable but I understand it is reasonable to try something else..  You have already tried anti-inflammatories including ibuprofen and Aleve. I will prescribe prednisone which is a different kind of an anti-inflammatory.  Please give that a try and if not sufficient would consider cortisone injection.  Prednisone prescribed to [facility] outpatient pharmacy.

## 2019-09-18 NOTE — Telephone Encounter (Signed)
Patient called stating that she was prescribed Nitroglycerin Patches and one of the side effects is headaches. She said that she already suffers bad headaches and it worried about using this prescription. Is there something else you would recommend. She also said that she was told she had inflammation. She asked if an anti-inflammatory could be prescribed to offer some relief.

## 2019-09-18 NOTE — Telephone Encounter (Signed)
Sent pt a MyChart message w/ Dr. Clovis Riley advice.

## 2019-09-23 ENCOUNTER — Ambulatory Visit: Payer: 59 | Admitting: Podiatry

## 2019-10-13 ENCOUNTER — Other Ambulatory Visit: Payer: Self-pay | Admitting: Family Medicine

## 2019-10-13 ENCOUNTER — Other Ambulatory Visit: Payer: 59

## 2019-10-13 DIAGNOSIS — Z20822 Contact with and (suspected) exposure to covid-19: Secondary | ICD-10-CM | POA: Diagnosis not present

## 2019-10-13 DIAGNOSIS — R609 Edema, unspecified: Secondary | ICD-10-CM

## 2019-10-13 DIAGNOSIS — I10 Essential (primary) hypertension: Secondary | ICD-10-CM

## 2019-10-13 MED ORDER — CHLORTHALIDONE 50 MG PO TABS: 50.0000 mg | ORAL_TABLET | Freq: Every day | ORAL | 0 refills | Status: DC

## 2019-10-13 MED FILL — CHLORTHALIDONE 50 MG TAB: 50 | 90 days supply | Qty: 90 | Fill #0

## 2019-10-13 NOTE — Telephone Encounter (Signed)
Medication Refill - Medication: amLODipine (NORVASC) 10 MG tablet chlorthalidone (HYGROTON) 50 MG tablet  Patient requests a 3 month supply     Preferred Pharmacy (with phone number or street name):  [place], [place] - [address] [facility]. Phone:  [phone]  Fax:  [phone]       Agent: Please be advised that RX refills may take up to 3 business days. We ask that you follow-up with your pharmacy.

## 2019-10-15 LAB — SARS-COV-2, NAA 2 DAY TAT

## 2019-10-15 LAB — NOVEL CORONAVIRUS, NAA: SARS-CoV-2, NAA: NOT DETECTED

## 2019-10-15 MED FILL — AMLODIPINE BESYLATE 10 MG T: 10 | 90 days supply | Qty: 90 | Fill #1

## 2019-12-25 ENCOUNTER — Telehealth: Payer: 59 | Admitting: Family Medicine

## 2019-12-28 ENCOUNTER — Other Ambulatory Visit (HOSPITAL_COMMUNITY): Payer: Self-pay | Admitting: Family Medicine

## 2019-12-28 DIAGNOSIS — M25552 Pain in left hip: Secondary | ICD-10-CM | POA: Diagnosis not present

## 2019-12-28 MED FILL — tiZANidine HCL 4 MG TABS: 4 | 10 days supply | Qty: 20 | Fill #0

## 2019-12-28 MED FILL — DICLOFENAC POT 50 MG TABLET: 50 | 20 days supply | Qty: 60 | Fill #0

## 2020-01-29 ENCOUNTER — Ambulatory Visit: Payer: 59 | Admitting: Family Medicine

## 2020-01-29 ENCOUNTER — Other Ambulatory Visit: Payer: Self-pay

## 2020-01-29 ENCOUNTER — Encounter: Payer: Self-pay | Admitting: Family Medicine

## 2020-01-29 VITALS — BP 120/80 | HR 70 | Ht 61.0 in | Wt 146.0 lb

## 2020-01-29 DIAGNOSIS — I10 Essential (primary) hypertension: Secondary | ICD-10-CM

## 2020-01-29 DIAGNOSIS — M12812 Other specific arthropathies, not elsewhere classified, left shoulder: Secondary | ICD-10-CM

## 2020-01-29 NOTE — Patient Instructions (Signed)
It was nice to meet you today,  I have put in referral to our sports medicine clinic for you to have your shoulder and foot evaluated.  This should take 1 to 2 weeks.  If you have not heard from somebody in a week call us and let us know.  You can stop taking the chlorthalidone.  Please continue take the amlodipine until you see me again.  I would like to see you back in 3 to 4 weeks to recheck your blood pressure.  Have a great day,  Clemetine Marker, MD

## 2020-01-29 NOTE — Progress Notes (Signed)
    SUBJECTIVE:   CHIEF COMPLAINT / HPI:   Hep c pap flu  Patient previously seen at [facility] for primary care needs.  Last visit was in may of this year.   HTN: takes amlodipine and chlorthalidone.  She would like to stop taking her bp medications.  She has been exercising more and has lost ten pounds since October.  .   Right foot: has been seeing a sports medicine specialist for right foot sprain and plantar fasciitis.  Wants to switch over to the sports medicine clinic at [facility] because she works here.  She has pain on the right Heel and plantar surface.   She previously wore a boot for two months and did rehab exercises. She will occasionally use ice for pain and swelling.     Left shoulder pain: .patient was reaching for something under her couch and felt a  Sudden sharp pain in her left shoulder.  This happened  In September of 2020. For a while she Couldn't lift her shoulder.  She still has pain that interferes with her ability to exercise her shoulders.  She has not had imaging on her shoulder.   Patient recently had a pap smear at [place].    PERTINENT  PMH / PSH:  She works at the lab at [employer].  She does not use tobacco, alcohol, or drugs. Not currently sexually active.  Lives with her pet french bulldog.     OBJECTIVE:   BP 120/80   Pulse 70   Ht 5\' 1"  (1.549 m)   Wt 146 lb (66.2 kg)   SpO2 98%   BMI 27.59 kg/m   Gen: alert, oriented. No acute distress.   Heent: perrla, eomi.  Moist oral mucosa.  Cv: rrr, no murmurs Pulm: lctab GI: soft, nontender.  MSK: left shoulder nontender to palpation. No gross visual abnormalities.  4/5 strength in left shoulder, 5/5 in right.  Empty can test positive for pain and mild weakness.  Pain with slightly decreased ROM on neer test.  Hawkins test positive.   ASSESSMENT/PLAN:   Hypertension BP has been normal on multiple previous recordings. Will stop chlorthalidone today, have pt follow up and decrease  amlodipine at that time if appropriate.    Rotator cuff arthropathy of left shoulder Possible partial tear of supraspinatus.  Sending in referral for sports med clinic at [facility] d/t patient's preference for her right foot complaints.  Will have them evaluate for rotator cuff injury as well.       Benay Pike, MD [place]

## 2020-01-31 DIAGNOSIS — I10 Essential (primary) hypertension: Secondary | ICD-10-CM | POA: Insufficient documentation

## 2020-01-31 DIAGNOSIS — M12812 Other specific arthropathies, not elsewhere classified, left shoulder: Secondary | ICD-10-CM | POA: Insufficient documentation

## 2020-01-31 NOTE — Assessment & Plan Note (Signed)
Possible partial tear of supraspinatus.  Sending in referral for sports med clinic at [facility] d/t patient's preference for her right foot complaints.  Will have them evaluate for rotator cuff injury as well.

## 2020-01-31 NOTE — Assessment & Plan Note (Signed)
BP has been normal on multiple previous recordings. Will stop chlorthalidone today, have pt follow up and decrease amlodipine at that time if appropriate.

## 2020-02-10 ENCOUNTER — Ambulatory Visit: Payer: 59 | Admitting: Family Medicine

## 2020-02-10 ENCOUNTER — Encounter: Payer: Self-pay | Admitting: Family Medicine

## 2020-02-10 ENCOUNTER — Other Ambulatory Visit: Payer: Self-pay

## 2020-02-10 ENCOUNTER — Other Ambulatory Visit: Payer: Self-pay | Admitting: Family Medicine

## 2020-02-10 VITALS — BP 122/78 | Ht 61.0 in | Wt 140.0 lb

## 2020-02-10 DIAGNOSIS — M67912 Unspecified disorder of synovium and tendon, left shoulder: Secondary | ICD-10-CM | POA: Diagnosis not present

## 2020-02-10 DIAGNOSIS — M722 Plantar fascial fibromatosis: Secondary | ICD-10-CM

## 2020-02-10 MED ORDER — MELOXICAM 15 MG PO TABS: 15.0000 mg | ORAL_TABLET | Freq: Every day | ORAL | 2 refills | Status: DC

## 2020-02-10 MED FILL — MELOXICAM 15 MG TABLET: 15 | 30 days supply | Qty: 30 | Fill #0

## 2020-02-10 NOTE — Progress Notes (Addendum)
PCP: Benay Pike, MD  Subjective:   HPI: Patient is a 55 y.o. female here for evaluation of right heel pain.  She reports that over the past year or so, she has had progressively worsening right heel pain.  It is on the medial aspect of her heel, especially worse when she first takes a few steps in the morning, and gets progressively worse throughout the day at her job in which she stands a lot.  She was seen at [facility] sports medicine on 09/18/2019, at that time was diagnosed with plantar fasciitis.  She was started on meloxicam, nitroglycerin patches and was trialed on a course of prednisone.  She reports that the meloxicam and prednisone were transiently helpful, and she was unable to tolerate the nitroglycerin patches due to headaches.  She has not been using any inserts or heel cups, and has not trialed any sort of therapy thus far.  Her pain has not changed in nature very much over the last 6 months or so, rather it is just continuing and is very painful.  Patient also notes that she has had left shoulder pain.  She had initial injury in 2020 in which she was reaching for something out of the couch, put weight awkwardly on her arm and felt a pop in severe pain in her shoulder.  She rehabbed this on her own for a while, and eventually went to an orthopedic group where she has received corticosteroid injections in the past with some benefit.  She now notes pain especially with internal rotation, but does not note any weakness, numbness or tingling.   Review of Systems:  Per HPI.   [place], medications and smoking status reviewed.      Objective:  Physical Exam:  No flowsheet data found.   Gen: awake, alert, NAD, comfortable in exam room Pulm: breathing unlabored  Right foot: Inspection:  No obvious bony deformity.  No swelling, erythema, or bruising.  Normal arch Palpation: Exquisitely tender to palpation on the distal medial aspect of the calcaneus ROM: Full  ROM of the ankle.  Normal midfoot flexibility Strength: 5/5 strength ankle in all planes Neurovascular: N/V intact distally in the lower extremity Special tests: Negative Tinel's over the tarsal tunnel.  Normal midfoot flexibility. Normal calcaneal motion with heel raise  Left shoulder: -Inspection: no obvious deformity, atrophy, or asymmetry. No bruising. No swelling -Palpation: mildly tender to palpation over the bicipital groove, nontender over [facility] joint -ROM: Full range of motion with flexion, abduction and external rotation.  She does have somewhat limited internal rotation mostly due to pain.  NV intact distally Normal scapular function observed. Special Tests:  - Impingement:  Positive Hawkins, Neg neers, positive empty can sign for pain. - Supraspinatus: Positive empty can.  5/5 strength with resisted flexion at 20 degrees - Infraspinatus/Teres Minor: 5/5 strength with ER - Subscapularis: 5/5 strength with IR, with pain - Biceps tendon:  Mildly positive speeds - Labrum: Negative Obriens, good stability - No painful arc and no drop arm sign     Assessment & Plan:  1.  Right plantar fasciitis Differential also includes tarsal tunnel syndrome given the burning sensation she described however exam and other descriptors most consistent with plantar fasciitis.  We discussed options including conservative management versus corticosteroid injection versus further imaging.  She is okay with starting with conservative management for now.  Plan: -Heel cups given today, would consider orthotics in the future -HEP gastrocnemius/soleus/plantar fascial stretching exercises given today, along with eccentric  calf raises -Roll frozen water bottle on foot at night -Meloxicam 15 mg as needed -Trial [place] sock or night splint -Follow-up in 1 to 2 months to reevaluate  2.  Left rotator cuff tendinopathy Patient also noted left shoulder pain which is a chronic issue for her since 2020.  Suspect she had a  significant tear back in 2020, however she does have good strength suggesting that she did not have a complete tear.  Unfortunately, I was unable to do a complete ultrasound examination today due to time constraints, so I will have her back to do a complete examination and consideration of corticosteroid injection based on findings.     Dagoberto Ligas, MD [facility] 02/10/2020 5:13 PM   I was the preceptor for this visit and available for immediate consultation Shellia Cleverly, DO

## 2020-02-12 ENCOUNTER — Ambulatory Visit (INDEPENDENT_AMBULATORY_CARE_PROVIDER_SITE_OTHER): Payer: 59 | Admitting: Family Medicine

## 2020-02-12 ENCOUNTER — Other Ambulatory Visit: Payer: Self-pay

## 2020-02-12 VITALS — BP 108/72 | Ht 61.0 in | Wt 140.0 lb

## 2020-02-12 DIAGNOSIS — M722 Plantar fascial fibromatosis: Secondary | ICD-10-CM

## 2020-02-12 MED ORDER — METHYLPREDNISOLONE ACETATE 40 MG/ML IJ SUSP
40.0000 mg | Freq: Once | INTRAMUSCULAR | Status: AC
  Administered 2020-02-12: 40 mg via INTRA_ARTICULAR

## 2020-02-12 NOTE — Progress Notes (Addendum)
   PCP: Benay Pike, MD  Subjective:   HPI: Patient is a 55 y.o. female here for reevaluation of right heel pain.  She was seen by me earlier this week, diagnosed with plantar fasciitis and started on conservative management.  She reports that after thinking about it further and after a particularly painful day yesterday, she called to inquire about plantar fascia corticosteroid injection.  She did not have any traumatic event, rather the pain just progressed to a point of intolerability.  No numbness or tingling, no sudden worsening.   Review of Systems:  Per HPI.   [place], medications and smoking status reviewed.      Objective:  Physical Exam:  No flowsheet data found.   Gen: awake, alert, NAD, comfortable in exam room Pulm: breathing unlabored  Right foot: Inspection:  No obvious bony deformity.  No swelling, erythema, or bruising.  Normal arch Palpation: Exquisitely tender to palpation on the distal medial aspect of the calcaneus ROM: Full  ROM of the ankle. Normal midfoot flexibility Strength: 5/5 strength ankle in all planes Neurovascular: N/V intact distally in the lower extremity Special tests: Negative Tinel's over the tarsal tunnel.  Normal midfoot flexibility. Normal calcaneal motion with heel raise   Assessment & Plan:  1.  Right plantar fasciitis  Patient presents for follow-up for reevaluation and consideration of CSI.  I think is reasonable at this time given the severity of her pain.  Discussed the importance of continuing previously mentioned therapies at last visit, as corticosteroid injection will help in the short-term for pain but does not alter long-term outcomes.  Patient voiced understanding.  Procedure note: Patient's clinical status is marked by significant pain and/or functional disability.  Other conservative therapies and not provide adequate relief or not indicated.  Risk and benefits of right plantar fascia injection were discussed with the  patient and she elected to proceed.  Prior to the procedure, timeout was called and correct location was identified.  Patient was laying on the right lateral decubitus position with right foot exposed.  Ultrasound was used to identify the plantar fascia at its insertion on the calcaneus.  She was then cleaned with alcohol swabs, and after regaining visualization with ultrasound under sterile conditions she was anesthetized with 2 cc of lidocaine injected with 1 cc of lidocaine and 1 cc of Depo-Medrol into the Perifascial region of the tendon.  This was well visualized under ultrasound, please see associated documentation for details.   Dagoberto Ligas, MD [facility] Fellow 02/12/2020 12:21 PM  Addendum:  I was the preceptor for this visit and available for immediate consultation.  Karlton Lemon MD Kirt Boys

## 2020-02-17 ENCOUNTER — Ambulatory Visit: Payer: 59 | Admitting: Family Medicine

## 2020-02-17 ENCOUNTER — Other Ambulatory Visit: Payer: Self-pay

## 2020-02-17 ENCOUNTER — Encounter: Payer: Self-pay | Admitting: Family Medicine

## 2020-02-17 VITALS — BP 124/88 | Ht 61.0 in | Wt 140.0 lb

## 2020-02-17 DIAGNOSIS — M67912 Unspecified disorder of synovium and tendon, left shoulder: Secondary | ICD-10-CM

## 2020-02-17 MED ORDER — METHYLPREDNISOLONE ACETATE 40 MG/ML IJ SUSP
40.0000 mg | Freq: Once | INTRAMUSCULAR | Status: AC
  Administered 2020-02-17: 40 mg via INTRA_ARTICULAR

## 2020-02-17 NOTE — Progress Notes (Addendum)
   PCP: Benay Pike, MD  Subjective:   HPI: Patient is a 55 y.o. female here for evaluation of left shoulder pain.  She was recently seen here by me regarding her plantar fasciitis, and received a corticosteroid injection into her right plantar fascia on 02/12/2020.  At her initial visit, she also noted that she was having left shoulder pain and we agreed for her to follow-up about this today.  In terms of her left shoulder pain, she reports that she had initial injury in 2020 in which she was reaching for something under the couch, twisted her arm in an awkward way and felt a pop and severe pain in her shoulder.  She did some home exercises and eventually went to an orthopedic group where she received multiple corticosteroid injections with some benefit.  Currently, she especially has pain with internal rotation, but has not noticed weakness, numbness or tingling.   Review of Systems:  Per HPI.   [place], medications and smoking status reviewed.      Objective:  Physical Exam:  No flowsheet data found.   Gen: awake, alert, NAD, comfortable in exam room Pulm: breathing unlabored  [place] left shoulder: -Biceps tendon: Well visualized within the bicipital groove and without any abnormalities -Pectoralis: Insertion visualized and without abnormalities. -Subscapularis: Well visualized to insertion point on humerus.  There is a significant area of cortical irregularity on the humerus at the insertion site of the subscapularis tendon.  Dynamic testing over the coracoid did not show signs of impingement. -AC joint: No osteophytes, no significant separation, negative geyser sign. -Supraspinatus: Well-visualized and without any abnormalities.  Dynamic testing did not reveal signs of impingement. -Subacromial bursa: No obvious enlargement or swelling. -Infraspinatus/teres minor: Insertion point on posterior humerus visualized and without abnormalities.  Impression: Chronic tendinopathy of the  subscapularis tendon with signs consistent with old partial tear.    Assessment & Plan:  1.  Rotator cuff tendinopathy  Patient with signs symptoms along with ultrasound findings consistent with subscapularis tendinopathy.  Surprisingly, supraspinatus was intact and did not look abnormal.  Suspect that her reaching under the couch incident caused a partial tear and she is having recurrent rotator cuff tendinopathy.  We discussed the options, she has not been successful with therapy in the past but has had good success with intermittent corticosteroid injections.  Therefore, she was given a corticosteroid injection today.  Follow-up as needed.  Procedure note: Patient is clinical status is marked by significant pain and/or functional disability.  Other conservative therapies not provided adequate relief or not indicated.  Risks and benefits of the procedure were discussed with the patient and she provided both verbal and written consent for left subacromial injection.  Patient was seated on the examination table with her shoulder exposed.  Timeout was called and her left shoulder was identified.  She was then cleaned with alcohol swab, anesthetized with ethyl chloride spray, and using the posterior approach she was injected with 40 mg of Depo-Medrol and 3 cc of 1% lidocaine into the subacromial space.  Patient tolerated the procedure well.    Dagoberto Ligas, MD [facility] 02/17/2020 3:39 PM  Addendum:  I was the preceptor for this visit and available for immediate consultation.  Karlton Lemon MD Kirt Boys

## 2020-02-22 ENCOUNTER — Ambulatory Visit: Payer: 59 | Admitting: Family Medicine

## 2020-04-06 ENCOUNTER — Ambulatory Visit: Payer: 59 | Admitting: Family Medicine

## 2020-04-06 ENCOUNTER — Other Ambulatory Visit: Payer: Self-pay

## 2020-04-06 ENCOUNTER — Other Ambulatory Visit: Payer: Self-pay | Admitting: Family Medicine

## 2020-04-06 VITALS — BP 122/86 | Ht 61.5 in | Wt 140.0 lb

## 2020-04-06 DIAGNOSIS — M5442 Lumbago with sciatica, left side: Secondary | ICD-10-CM

## 2020-04-06 MED ORDER — HYDROCODONE-ACETAMINOPHEN 5-325 MG PO TABS
1.0000 | ORAL_TABLET | Freq: Four times a day (QID) | ORAL | 0 refills | Status: DC | PRN
Start: 2020-04-06 — End: 2020-04-06

## 2020-04-06 MED ORDER — METHOCARBAMOL 500 MG PO TABS: 500.0000 mg | ORAL_TABLET | Freq: Three times a day (TID) | ORAL | 1 refills | Status: DC | PRN

## 2020-04-06 NOTE — Patient Instructions (Addendum)
You have lumbar radiculopathy (a pinched nerve in your low back). Start diclofenac 75mg  twice a day with food for pain and inflammation - let me know if you need more of this. Norco as needed for severe pain (no driving on this medicine). Robaxin as needed for muscle spasms (no driving on this medicine if it makes you sleepy). Stay as active as possible. Physical therapy has been shown to be helpful as well. Strengthening of low back muscles, abdominal musculature are key for long term pain relief. If not improving, will consider further imaging (MRI). Let me know how you're doing in 1 week at the latest.

## 2020-04-07 ENCOUNTER — Telehealth: Payer: Self-pay | Admitting: Family Medicine

## 2020-04-07 ENCOUNTER — Encounter: Payer: Self-pay | Admitting: Family Medicine

## 2020-04-07 NOTE — Telephone Encounter (Signed)
Patient is calling stating she had an appointment with doctor Hudnall yesterday and is calling wanting to know if or when does she need to go to chiropractor and would it be good for her or not. Please advise. Call back number [phone]. Thanks!

## 2020-04-07 NOTE — Progress Notes (Signed)
PCP: Benay Pike, MD  Subjective:   HPI: Patient is a 55 y.o. female here for low back pain.  Patient reports on Sunday she bent down and felt a sharp pain across her low back. Associated pain radiating down left leg all the way to the foot. No numbness/tingling. No bowel/bladder dysfunction. She has had problems with low back in the past - had an ablation after 2017 - did ok after this. Past few days has been taking aleve or advil, using ice/heat without much benefit.  Past Medical History:  Diagnosis Date  . Hypertension   . Tuberculosis    tx in 1987.  Hx of neg chest x-rays    Current Outpatient Medications on File Prior to Visit  Medication Sig Dispense Refill  . amLODipine (NORVASC) 10 MG tablet Take 1 tablet (10 mg total) by mouth daily. 90 tablet 1  . D3-50 1.25 MG (50000 UT) capsule Take 50,000 Units by mouth once a week.    . diclofenac (CATAFLAM) 50 MG tablet Take 50 mg by mouth 3 (three) times daily as needed.     No current facility-administered medications on file prior to visit.    Past Surgical History:  Procedure Laterality Date  . BREAST SURGERY    . SPINE SURGERY    . WISDOM TOOTH EXTRACTION      Allergies  Allergen Reactions  . Tramadol Other (See Comments)    Tremors    Social History   Socioeconomic History  . Marital status: Single    Spouse name: Not on file  . Number of children: Not on file  . Years of education: Not on file  . Highest education level: Not on file  Occupational History  . Not on file  Tobacco Use  . Smoking status: Never Smoker  . Smokeless tobacco: Never Used  Vaping Use  . Vaping Use: Never used  Substance and Sexual Activity  . Alcohol use: Never  . Drug use: Never  . Sexual activity: Not on file  Other Topics Concern  . Not on file  Social History Narrative  . Not on file   Social Determinants of Health   Financial Resource Strain: Not on file  Food Insecurity: Not on file  Transportation Needs:  Not on file  Physical Activity: Not on file  Stress: Not on file  Social Connections: Not on file  Intimate Partner Violence: Not on file    Family History  Problem Relation Age of Onset  . Hypertension Mother   . Hypertension Father   . Cancer Brother   . Esophageal cancer Brother   . Colon cancer Neg Hx   . Colon polyps Neg Hx   . Rectal cancer Neg Hx   . Stomach cancer Neg Hx     BP 122/86   Ht 5' 1.5" (1.562 m)   Wt 140 lb (63.5 kg)   BMI 26.02 kg/m   No flowsheet data found.  No flowsheet data found.  Review of Systems: See HPI above.     Objective:  Physical Exam:  Gen: NAD, comfortable in exam room  Back: No gross deformity, scoliosis. TTP left paraspinal lumbar region.  No midline or bony TTP. ROM limited in flexion and extension. Strength LEs 5/5 all muscle groups.   2+ MSRs in patellar and achilles tendons, equal bilaterally. Negative SLRs. Sensation intact to light touch bilaterally. Negative logroll bilateral hips.   Assessment & Plan:  1. Low back pain - with radiation into left lower  extremity.  Consistent with lumbar radiculopathy.  Exam reassuring and no red flags.  Discussed options - start diclofenac with norco and robaxin as needed.  Consider MRI, physical therapy depending on her progress over next week.

## 2020-04-08 NOTE — Telephone Encounter (Signed)
I typically would recommend physical therapy before chiropractic care for an acute low back injury - ok to refer her if she would like to do this.  If she prefers chiropractic care please provide her [facility] contact info.  Thanks!

## 2020-05-02 ENCOUNTER — Other Ambulatory Visit: Payer: Self-pay

## 2020-05-02 ENCOUNTER — Other Ambulatory Visit (HOSPITAL_COMMUNITY): Payer: Self-pay

## 2020-05-02 MED ORDER — DICLOFENAC SODIUM 75 MG PO TBEC
75.0000 mg | DELAYED_RELEASE_TABLET | Freq: Two times a day (BID) | ORAL | 0 refills | Status: DC
  Filled 2020-05-02: qty 60, 30d supply, fill #0

## 2020-05-02 NOTE — Progress Notes (Signed)
Pt called requesting refill on Diclofenac 75 mg.

## 2020-07-15 ENCOUNTER — Other Ambulatory Visit (HOSPITAL_COMMUNITY): Payer: Self-pay

## 2020-07-15 MED ORDER — AMOXICILLIN 500 MG PO CAPS
500.0000 mg | ORAL_CAPSULE | Freq: Four times a day (QID) | ORAL | 0 refills | Status: AC
  Filled 2020-07-15: qty 28, 7d supply, fill #0

## 2020-07-15 MED ORDER — IBUPROFEN 600 MG PO TABS
600.0000 mg | ORAL_TABLET | ORAL | 0 refills | Status: DC
  Filled 2020-07-15: qty 10, 2d supply, fill #0

## 2020-07-20 ENCOUNTER — Ambulatory Visit: Payer: Self-pay

## 2020-07-20 ENCOUNTER — Other Ambulatory Visit (HOSPITAL_COMMUNITY): Payer: Self-pay

## 2020-07-20 ENCOUNTER — Ambulatory Visit: Payer: 59 | Admitting: Family Medicine

## 2020-07-20 VITALS — Ht 61.5 in | Wt 138.0 lb

## 2020-07-20 DIAGNOSIS — M25512 Pain in left shoulder: Secondary | ICD-10-CM

## 2020-07-20 MED ORDER — DICLOFENAC SODIUM 75 MG PO TBEC
75.0000 mg | DELAYED_RELEASE_TABLET | Freq: Two times a day (BID) | ORAL | 1 refills | Status: AC
  Filled 2020-07-20: qty 60, 30d supply, fill #0

## 2020-07-20 MED ORDER — HYDROCODONE-ACETAMINOPHEN 5-325 MG PO TABS
1.0000 | ORAL_TABLET | Freq: Four times a day (QID) | ORAL | 0 refills | Status: AC | PRN
  Filled 2020-07-20: qty 20, 5d supply, fill #0

## 2020-07-20 NOTE — Patient Instructions (Signed)
You have strained your rotator cuff (supraspinatus muscle) Try to avoid painful activities (overhead activities, lifting with extended arm) as much as possible. Diclofenac 75mg  twice a day with food for pain and inflammation - take regularly for 7 days then as needed. Can take tylenol in addition to this. Consider a sling to help rest this. Arm circles, swings, table slides to help regain your motion. Work restrictions as noted. If not improving at follow-up we will consider further imaging, injection, physical therapy, and/or nitro patches. Follow up with me in 2 weeks.

## 2020-07-21 ENCOUNTER — Encounter: Payer: Self-pay | Admitting: Family Medicine

## 2020-07-21 NOTE — Progress Notes (Signed)
PCP: Benay Pike, MD  Subjective:   HPI: Tracey Church is a 55 y.o. female here for left shoulder pain.  Tracey Church reports just over a week ago she reached with her left arm into the bathtub and felt a pop in her left shoulder laterally with severe sharp pain. Has not been able to move the shoulder normally ever since this. She has been icing and did take some medication provided by the emergency department on her visit there but did not take anything currently. She is right-handed. Of note she had similar issues with this left shoulder earlier this year but this had improved prior to this new injury. The mechanism of this injury was similar to this injury earlier this year.  Past Medical History:  Diagnosis Date   Hypertension    Tuberculosis    tx in 1987.  Hx of neg chest x-rays    Current Outpatient Medications on File Prior to Visit  Medication Sig Dispense Refill   amLODipine (NORVASC) 10 MG tablet Take 1 tablet (10 mg total) by mouth daily. 90 tablet 1   amoxicillin (AMOXIL) 500 MG capsule Take 1 capsule (500 mg total) by mouth 4 (four) times daily. 28 capsule 0   D3-50 1.25 MG (50000 UT) capsule Take 50,000 Units by mouth once a week.     No current facility-administered medications on file prior to visit.    Past Surgical History:  Procedure Laterality Date   BREAST SURGERY     SPINE SURGERY     WISDOM TOOTH EXTRACTION      Allergies  Allergen Reactions   Tramadol Other (See Comments)    Tremors    Social History   Socioeconomic History   Marital status: Single    Spouse name: Not on file   Number of children: Not on file   Years of education: Not on file   Highest education level: Not on file  Occupational History   Not on file  Tobacco Use   Smoking status: Never   Smokeless tobacco: Never  Vaping Use   Vaping Use: Never used  Substance and Sexual Activity   Alcohol use: Never   Drug use: Never   Sexual activity: Not on file  Other Topics Concern    Not on file  Social History Narrative   Not on file   Social Determinants of Health   Financial Resource Strain: Not on file  Food Insecurity: Not on file  Transportation Needs: Not on file  Physical Activity: Not on file  Stress: Not on file  Social Connections: Not on file  Intimate Partner Violence: Not on file    Family History  Problem Relation Age of Onset   Hypertension Mother    Hypertension Father    Cancer Brother    Esophageal cancer Brother    Colon cancer Neg Hx    Colon polyps Neg Hx    Rectal cancer Neg Hx    Stomach cancer Neg Hx     Ht 5' 1.5" (1.562 m)   Wt 138 lb (62.6 kg)   BMI 25.65 kg/m   No flowsheet data found.  No flowsheet data found.  Review of Systems: See HPI above.     Objective:  Physical Exam:  Gen: NAD, comfortable in exam room  Left shoulder: No swelling, ecchymoses.  No gross deformity. No TTP AC joint, biceps tendon. Full IR and ER, abduction and flexion limited actively to 90 degrees and painful. Positive Hawkins, Neers. Negative Yergasons. Strength 5/5  with empty can and resisted internal/external rotation.  Pain empty can > ER. NV intact distally. Complete MSK u/s left shoulder: Biceps tendon: intact long and short views without tenosynovitis Pec major tendon: intact Subscapularis: intact AC joint: mild arthropathy and effusion, no tenderness with compression of probe Infraspinatus: intact Supraspinatus: intact though slightly limited due to pain - unable to fully position in modified crass Posterior glenohumeral joint: no effusion, labral tear, paralabral cyst  Impression:  Mild AC arthropathy and effusion but no pain on compression Otherwise normal ultrasound   Assessment & Plan:  1. Left shoulder injury - ultrasound is reassuring.  Consistent with strain of supraspinatus.  Diclofenac twice a day for 7 days then as needed.  Motion exercises.  Icing.  Work restrictions.  F/u in 2 weeks for reevaluation.

## 2020-07-27 ENCOUNTER — Ambulatory Visit: Payer: 59 | Admitting: Family Medicine

## 2020-08-03 ENCOUNTER — Ambulatory Visit: Payer: 59 | Admitting: Family Medicine

## 2020-08-03 DIAGNOSIS — H524 Presbyopia: Secondary | ICD-10-CM | POA: Diagnosis not present

## 2021-05-09 ENCOUNTER — Ambulatory Visit (HOSPITAL_COMMUNITY)
Admission: RE | Admit: 2021-05-09 | Discharge: 2021-05-09 | Disposition: A | Discharge status: Home / Self Care | Payer: Self-pay | Source: Ambulatory Visit | Attending: Family Medicine | Admitting: Family Medicine

## 2021-05-09 ENCOUNTER — Other Ambulatory Visit (HOSPITAL_COMMUNITY): Payer: Self-pay | Admitting: Family Medicine

## 2021-05-09 DIAGNOSIS — R7611 Nonspecific reaction to tuberculin skin test without active tuberculosis: Secondary | ICD-10-CM | POA: Diagnosis not present

## 2021-05-17 ENCOUNTER — Emergency Department (HOSPITAL_COMMUNITY)
Admission: EM | Admit: 2021-05-17 | Discharge: 2021-05-17 | Disposition: A | Discharge status: Home / Self Care | Payer: PRIVATE HEALTH INSURANCE | Attending: Emergency Medicine | Admitting: Emergency Medicine

## 2021-05-17 ENCOUNTER — Emergency Department (HOSPITAL_COMMUNITY): Payer: PRIVATE HEALTH INSURANCE

## 2021-05-17 ENCOUNTER — Encounter (HOSPITAL_COMMUNITY): Payer: Self-pay | Admitting: Oncology

## 2021-05-17 ENCOUNTER — Other Ambulatory Visit: Payer: Self-pay

## 2021-05-17 DIAGNOSIS — M25512 Pain in left shoulder: Secondary | ICD-10-CM | POA: Insufficient documentation

## 2021-05-17 DIAGNOSIS — I10 Essential (primary) hypertension: Secondary | ICD-10-CM | POA: Diagnosis not present

## 2021-05-17 DIAGNOSIS — S4992XA Unspecified injury of left shoulder and upper arm, initial encounter: Secondary | ICD-10-CM | POA: Diagnosis present

## 2021-05-17 DIAGNOSIS — Z79899 Other long term (current) drug therapy: Secondary | ICD-10-CM | POA: Diagnosis not present

## 2021-05-17 DIAGNOSIS — S46912A Strain of unspecified muscle, fascia and tendon at shoulder and upper arm level, left arm, initial encounter: Secondary | ICD-10-CM | POA: Insufficient documentation

## 2021-05-17 DIAGNOSIS — X501XXA Overexertion from prolonged static or awkward postures, initial encounter: Secondary | ICD-10-CM | POA: Diagnosis not present

## 2021-05-17 MED ORDER — HYDROMORPHONE HCL 1 MG/ML IJ SOLN
1.0000 mg | Freq: Once | INTRAMUSCULAR | Status: AC
  Administered 2021-05-17: 1 mg via INTRAMUSCULAR
  Filled 2021-05-17: qty 1

## 2021-05-17 MED ORDER — ONDANSETRON 4 MG PO TBDP
4.0000 mg | ORAL_TABLET | Freq: Once | ORAL | Status: AC
  Administered 2021-05-17: 4 mg via ORAL
  Filled 2021-05-17: qty 1

## 2021-05-17 MED ORDER — IBUPROFEN 800 MG PO TABS: 800.0000 mg | ORAL_TABLET | Freq: Three times a day (TID) | ORAL | 1 refills | Status: AC | PRN

## 2021-05-17 NOTE — ED Provider Notes (Signed)
?[facility] DEPT ?Provider Note ? ? ?CSN: [id number] ?Arrival date & time: 05/17/21  3790 ? ?  ? ?History ? ?Chief Complaint  ?Patient presents with  ? Shoulder Pain  ? ? ?Tracey Church is a 56 y.o. female. ? ?Patient was pulling something with her left arm and started to have severe pain in her left shoulder.  Patient has a history of hypertension. ? ?The history is provided by the patient and medical records. No language interpreter was used.  ?Shoulder Pain ?Location:  Shoulder ?Shoulder location:  L shoulder ?Pain details:  ?  Quality:  Aching ?  Radiates to:  Does not radiate ?  Severity:  Moderate ?  Onset quality:  Sudden ?  Timing:  Constant ?Dislocation: no   ?Relieved by:  Nothing ?Worsened by:  Nothing ?Associated symptoms: no back pain and no fatigue   ? ?  ? ?Home Medications ?Prior to Admission medications   ?Medication Sig Start Date End Date Taking? Authorizing Provider  ?ibuprofen (ADVIL) 800 MG tablet Take 1 tablet (800 mg total) by mouth every 8 (eight) hours as needed for moderate pain. 05/17/21  Yes Milton Ferguson, MD  ?amLODipine (NORVASC) 10 MG tablet Take 1 tablet (10 mg total) by mouth daily. 05/26/19   Maximiano Coss, NP  ?amoxicillin (AMOXIL) 500 MG capsule Take 1 capsule (500 mg total) by mouth 4 (four) times daily. 07/14/20     ?D3-50 1.25 MG (50000 UT) capsule Take 50,000 Units by mouth once a week. 04/02/19   [provider]  ?diclofenac (VOLTAREN) 75 MG EC tablet Take 1 tablet (75 mg total) by mouth 2 (two) times daily with a meal. 07/20/20   Hudnall, Sharyn Lull, MD  ?   ? ?Allergies    ?Tramadol   ? ?Review of Systems   ?Review of Systems  ?Constitutional:  Negative for appetite change and fatigue.  ?HENT:  Negative for congestion, ear discharge and sinus pressure.   ?Eyes:  Negative for discharge.  ?Respiratory:  Negative for cough.   ?Cardiovascular:  Negative for chest pain.  ?Gastrointestinal:  Negative for abdominal pain and diarrhea.   ?Genitourinary:  Negative for frequency and hematuria.  ?Musculoskeletal:  Negative for back pain.  ?     Left arm pain  ?Skin:  Negative for rash.  ?Neurological:  Negative for seizures and headaches.  ?Psychiatric/Behavioral:  Negative for hallucinations.   ? ?Physical Exam ?Updated Vital Signs ?BP (!) 151/96   Pulse 73   Temp 97.6 ?F (36.4 ?C) (Oral)   Resp 18   Ht 5' 1.5" (1.562 m)   Wt 66.7 kg   SpO2 99%   BMI 27.33 kg/m?  ?Physical Exam ?Vitals reviewed.  ?Constitutional:   ?   Appearance: She is well-developed.  ?HENT:  ?   Head: Normocephalic.  ?   Mouth/Throat:  ?   Mouth: Mucous membranes are moist.  ?Eyes:  ?   General: No scleral icterus. ?   Conjunctiva/sclera: Conjunctivae normal.  ?Neck:  ?   Thyroid: No thyromegaly.  ?Cardiovascular:  ?   Rate and Rhythm: Normal rate and regular rhythm.  ?   Heart sounds: No murmur heard. ?  No friction rub. No gallop.  ?Pulmonary:  ?   Breath sounds: No stridor. No wheezing or rales.  ?Chest:  ?   Chest wall: No tenderness.  ?Abdominal:  ?   General: There is no distension.  ?   Tenderness: There is no abdominal tenderness. There is no rebound.  ?  Musculoskeletal:  ?   Cervical back: Neck supple.  ?   Comments: Tender left shoulder  ?Lymphadenopathy:  ?   Cervical: No cervical adenopathy.  ?Skin: ?   Findings: No erythema or rash.  ?Neurological:  ?   Mental Status: She is alert and oriented to person, place, and time.  ?   Motor: No abnormal muscle tone.  ?   Coordination: Coordination normal.  ?Psychiatric:     ?   Behavior: Behavior normal.  ? ? ?ED Results / Procedures / Treatments   ?Labs ?(all labs ordered are listed, but only abnormal results are displayed) ?Labs Reviewed - No data to display ? ?EKG ?None ? ?Radiology ?DG Shoulder Left ? ?Result Date: 05/17/2021 ?CLINICAL DATA:  Anterior left shoulder pain after shoulder injury yesterday. Pulled a laundry cart through a door and pulled left shoulder. EXAM: LEFT SHOULDER - 2+ VIEW COMPARISON:  None.  FINDINGS: Normal bone mineralization. The glenohumeral and acromioclavicular joint spaces are normally aligned. No significant osteoarthritis. No acute fracture or dislocation. The visualized portion of the left lung is unremarkable. IMPRESSION: Normal left shoulder radiographs. Electronically Signed   By: Yvonne Kendall M.D.   On: 05/17/2021 09:43   ? ?Procedures ?Procedures  ? ? ?Medications Ordered in ED ?Medications  ?HYDROmorphone (DILAUDID) injection 1 mg (1 mg Intramuscular Given 05/17/21 0932)  ?ondansetron (ZOFRAN-ODT) disintegrating tablet 4 mg (4 mg Oral Given 05/17/21 1044)  ? ? ?ED Course/ Medical Decision Making/ A&P ?  ?                        ?Medical Decision Making ?Amount and/or Complexity of Data Reviewed ?Radiology: ordered. ? ?Risk ?Prescription drug management. ? ?This patient presents to the ED for concern of left arm and shoulder pain, this involves an extensive number of treatment options, and is a complaint that carries with it a high risk of complications and morbidity.  The differential diagnosis includes ligament injury left shoulder, dislocation left shoulder ? ? ?Co morbidities that complicate the patient evaluation ? ?Hypertension ? ? ?Additional history obtained: ? ?Additional history obtained from patient ?External records from outside source obtained and reviewed including hospital record ? ? ?Lab Tests: ?No labs ?Imaging Studies ordered: ? ?I ordered imaging studies including left shoulder ?I independently visualized and interpreted imaging which showed negative ?I agree with the radiologist interpretation ? ? ?Cardiac Monitoring: / EKG: ? ?The patient was maintained on a cardiac monitor.  I personally viewed and interpreted the cardiac monitored which showed an underlying rhythm of: Normal sinus rhythm ? ? ?Consultations Obtained: ? ?No consult ?Problem List / ED Course / Critical interventions / Medication management ? ?Hypertension and shoulder pain ?I ordered medication  including Dilaudid for pain ?Reevaluation of the patient after these medicines showed that the patient improved ?I have reviewed the patients home medicines and have made adjustments as needed ? ? ?Social Determinants of Health: ? ?None ? ? ?Test / Admission - Considered: ? ?MRI shoulder ? ?X-rays unremarkable.  Patient has a left shoulder strain.  She is put in a sling and given Motrin and will follow-up with orthopedics next week ? ? ? ? ? ? ? ?Final Clinical Impression(s) / ED Diagnoses ?Final diagnoses:  ?Strain of left shoulder, initial encounter  ? ? ?Rx / DC Orders ?ED Discharge Orders   ? ?      Ordered  ?  ibuprofen (ADVIL) 800 MG tablet  Every 8 hours PRN       ?  05/17/21 1127  ? ?  ?  ? ?  ? ? ?  ?Milton Ferguson, MD ?05/18/21 1744 ? ?

## 2021-05-17 NOTE — ED Notes (Signed)
Placed pt on 2/M nasal cannula due to SpO2 readings room air 88-90% RA. Pt currently denies difficulty breathing/shob. ?

## 2021-05-17 NOTE — Discharge Instructions (Signed)
Follow-up with your orthopedic surgeon next week ?

## 2021-05-17 NOTE — ED Notes (Signed)
Pt ambulatory without assistance.  

## 2021-05-17 NOTE — ED Triage Notes (Signed)
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  ?

## 2021-05-18 ENCOUNTER — Ambulatory Visit: Payer: Self-pay | Admitting: Family Medicine

## 2021-05-18 NOTE — Progress Notes (Deleted)
   I, Wendy Poet, LAT, ATC, am serving as scribe for Dr. Lynne Leader.  Tracey Church is a 56 y.o. female who presents to [facility] today for L shoulder pain.  She was last seen by Dr. Georgina Snell on 09/17/19 for R heel pain.  Today, she reports L shoulder pain since 05/17/21 when she ...  She was seen at the [facility] ED w/ c/o L shoulder and arm pain.  She locates her pain to .  Radiating pain: L shoulder mechanical symptoms: Aggravating factors: Treatments tried:   Diagnostic testing: L shoulder XR- 05/17/21  Pertinent review of systems: ***  Relevant historical information: ***   Exam:  There were no vitals taken for this visit. General: Well Developed, well nourished, and in no acute distress.   MSK: ***    Lab and Radiology Results No results found for this or any previous visit (from the past 72 hour(s)). DG Shoulder Left  Result Date: 05/17/2021 CLINICAL DATA:  Anterior left shoulder pain after shoulder injury yesterday. Pulled a laundry cart through a door and pulled left shoulder. EXAM: LEFT SHOULDER - 2+ VIEW COMPARISON:  None. FINDINGS: Normal bone mineralization. The glenohumeral and acromioclavicular joint spaces are normally aligned. No significant osteoarthritis. No acute fracture or dislocation. The visualized portion of the left lung is unremarkable. IMPRESSION: Normal left shoulder radiographs. Electronically Signed   By: Yvonne Kendall M.D.   On: 05/17/2021 09:43       Assessment and Plan: 56 y.o. female with ***   PDMP not reviewed this encounter. No orders of the defined types were placed in this encounter.  No orders of the defined types were placed in this encounter.    Discussed warning signs or symptoms. Please see discharge instructions. Patient expresses understanding.   ***

## 2021-12-09 IMAGING — US US THYROID
1 series · 14 of 25 positions shown · non-contrast
Comparison: None.

CLINICAL DATA: 54-year-old female with a history of thyroid goiter

EXAM:
THYROID ULTRASOUND
TECHNIQUE: Ultrasound examination of the thyroid gland and adjacent soft
tissues was performed.

[Series 1: us thyroid · 0.06mm/px · 14 of 36 slices shown]
[im 1/36]
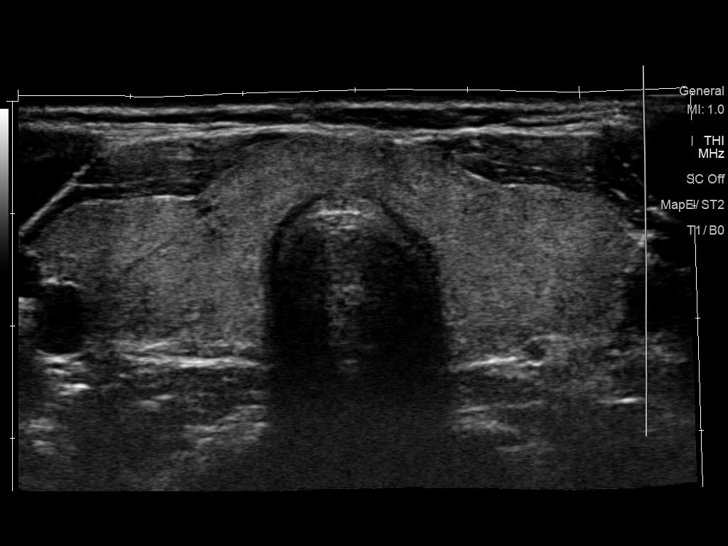
[im 3/36]
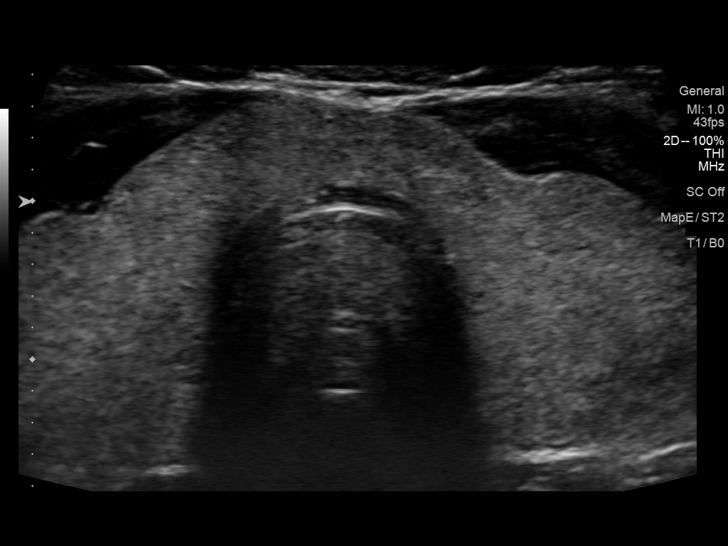
[im 6/36]
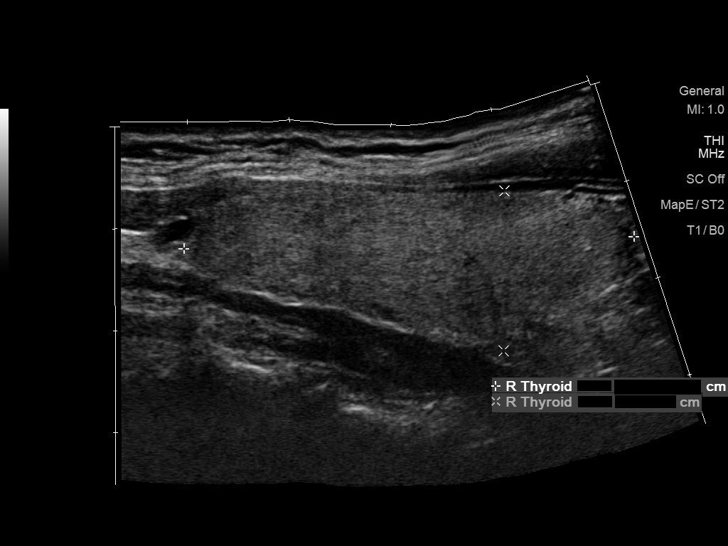
[im 9/36]
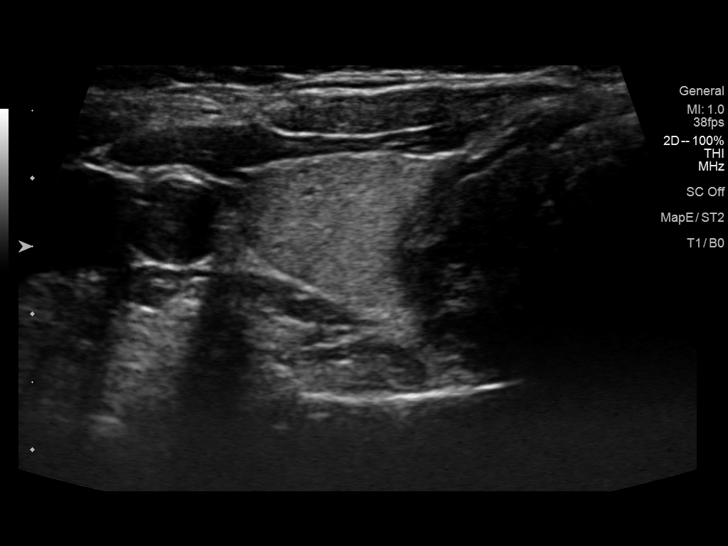
[im 12/36]
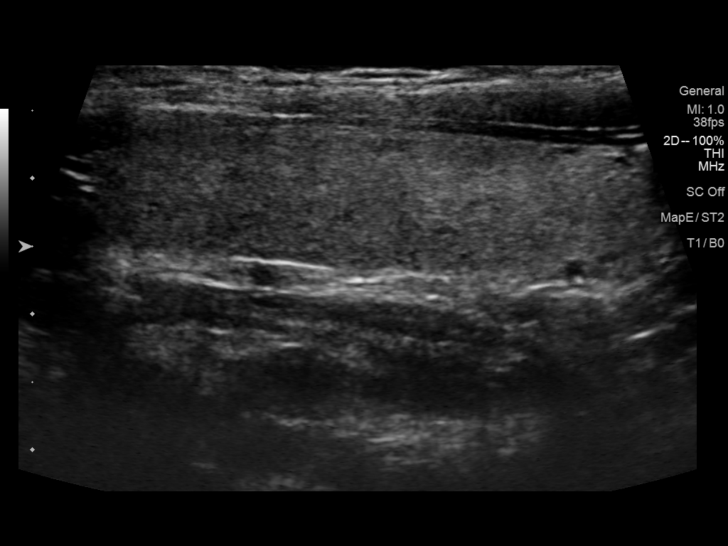
[im 14/36]
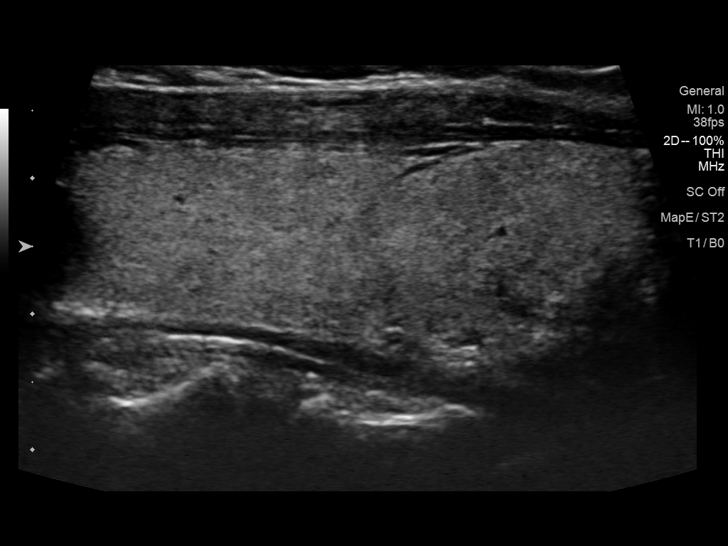
[im 17/36]
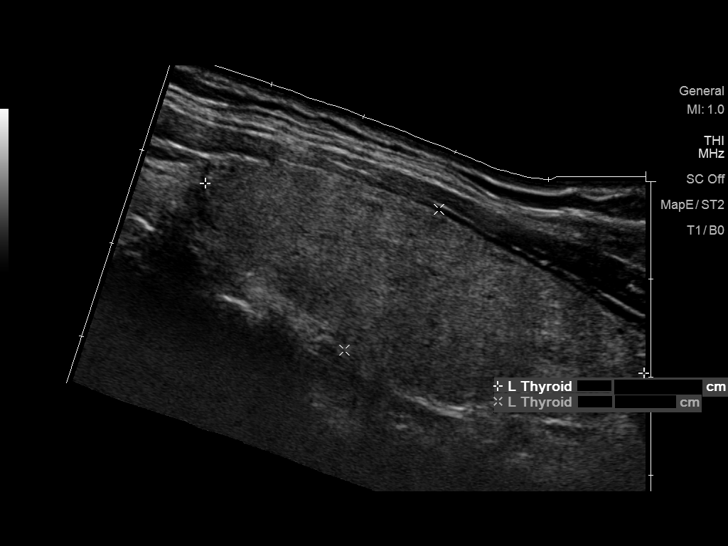
[im 19/36]
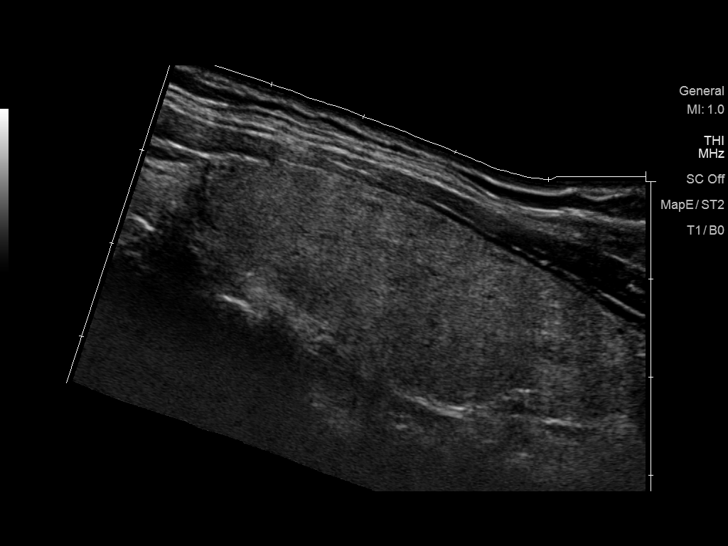
[im 22/36]
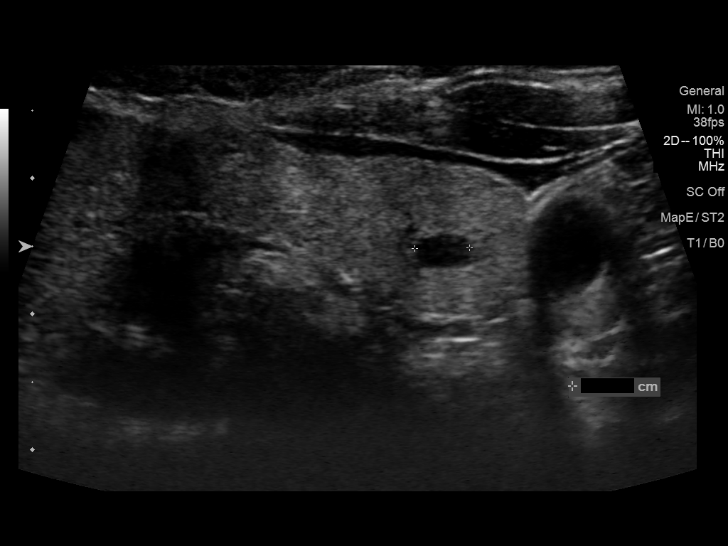
[im 24/36]
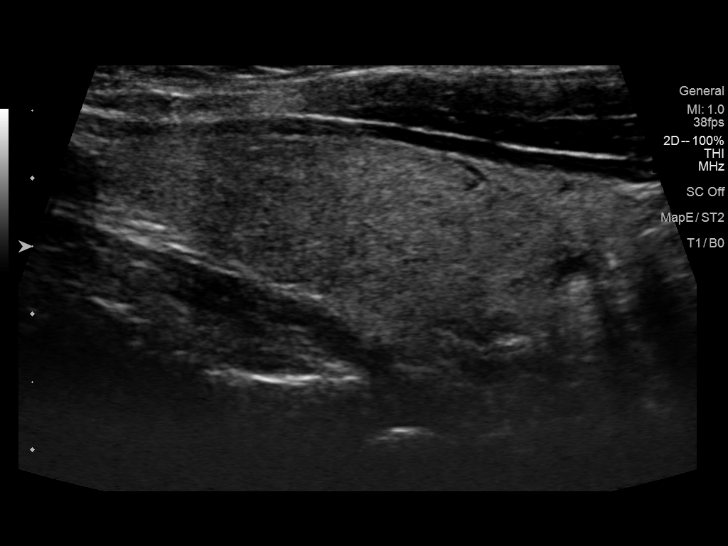
[im 27/36]
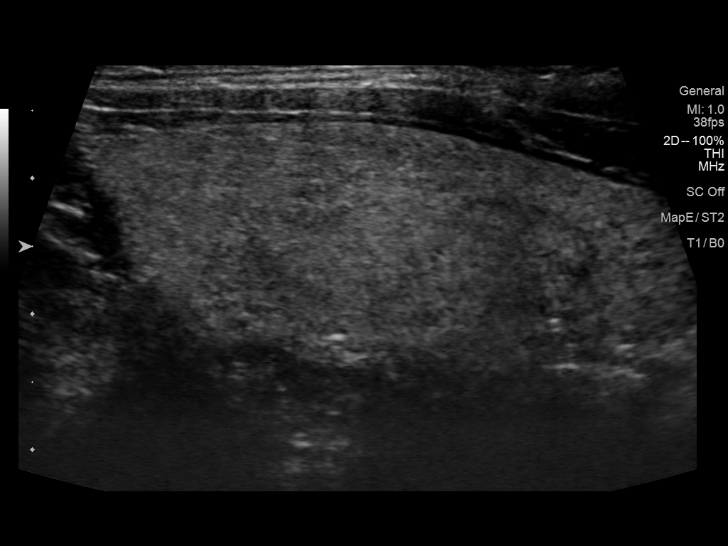
[im 30/36]
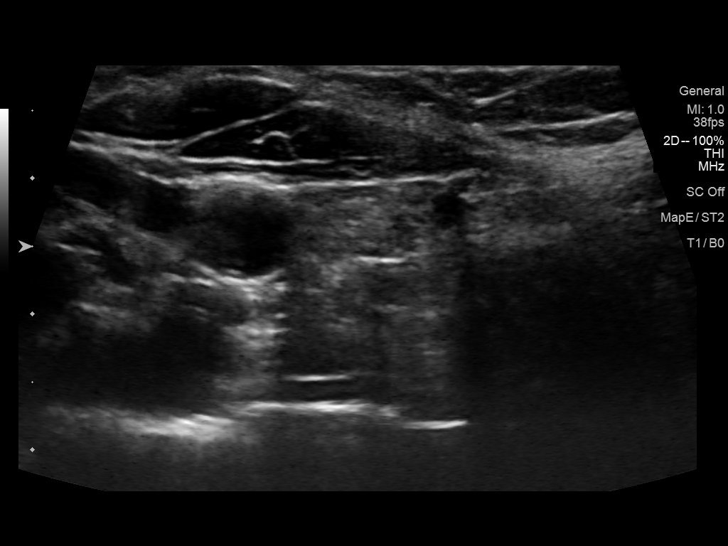
[im 33/36]
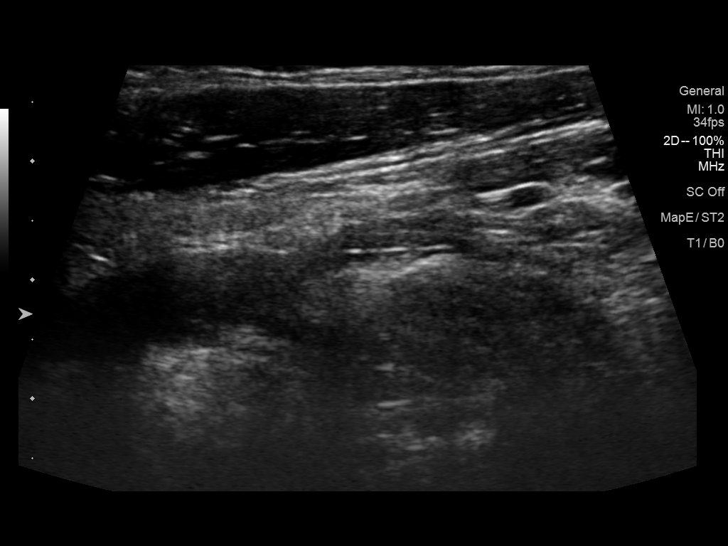
[im 36/36]
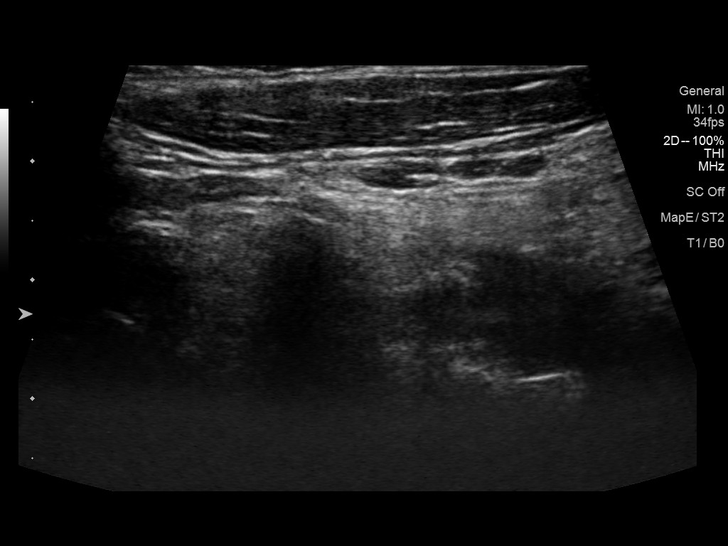

[14 of 25 positions shown; findings below may reference images not displayed]

FINDINGS: Parenchymal Echotexture: Moderately heterogenous

Isthmus: 0.6 cm

Right lobe: 4.4 cm x 1.6 cm x 2.3 cm

Left lobe: 4.9 cm x 1.7 cm x 2.0 cm

_________________________________________________________

Estimated total number of nodules >/= 1 cm: 0

Number of spongiform nodules >/=  2 cm not described below (TR1): 0

Number of mixed cystic and solid nodules >/= 1.5 cm not described
below (TR2): 0

_________________________________________________________

No discrete nodules are seen within the thyroid gland.

No adenopathy
IMPRESSION: Heterogeneous appearance of thyroid tissue, suggesting medical
thyroid disease.

## 2022-11-23 ENCOUNTER — Other Ambulatory Visit (HOSPITAL_COMMUNITY): Payer: Self-pay

## 2022-11-23 DIAGNOSIS — Z0000 Encounter for general adult medical examination without abnormal findings: Secondary | ICD-10-CM | POA: Diagnosis not present

## 2022-11-23 DIAGNOSIS — R7303 Prediabetes: Secondary | ICD-10-CM | POA: Diagnosis not present

## 2022-11-23 DIAGNOSIS — I10 Essential (primary) hypertension: Secondary | ICD-10-CM | POA: Diagnosis not present

## 2022-11-23 MED ORDER — AMLODIPINE BESYLATE 5 MG PO TABS
5.0000 mg | ORAL_TABLET | Freq: Every day | ORAL | 3 refills | Status: AC
  Filled 2022-11-23: qty 30, 30d supply, fill #0
  Filled 2023-03-14: qty 30, 30d supply, fill #1

## 2022-11-23 MED ORDER — ESCITALOPRAM OXALATE 10 MG PO TABS
ORAL_TABLET | ORAL | 0 refills | Status: AC
  Filled 2022-11-23: qty 32, 23d supply, fill #0

## 2022-11-29 DIAGNOSIS — Z1151 Encounter for screening for human papillomavirus (HPV): Secondary | ICD-10-CM | POA: Diagnosis not present

## 2022-11-29 DIAGNOSIS — Z01419 Encounter for gynecological examination (general) (routine) without abnormal findings: Secondary | ICD-10-CM | POA: Diagnosis not present

## 2022-11-29 DIAGNOSIS — Z124 Encounter for screening for malignant neoplasm of cervix: Secondary | ICD-10-CM | POA: Diagnosis not present

## 2022-12-11 DIAGNOSIS — Z1231 Encounter for screening mammogram for malignant neoplasm of breast: Secondary | ICD-10-CM | POA: Diagnosis not present

## 2022-12-24 ENCOUNTER — Other Ambulatory Visit (HOSPITAL_COMMUNITY): Payer: Self-pay

## 2022-12-24 DIAGNOSIS — I10 Essential (primary) hypertension: Secondary | ICD-10-CM | POA: Diagnosis not present

## 2022-12-24 DIAGNOSIS — F411 Generalized anxiety disorder: Secondary | ICD-10-CM | POA: Diagnosis not present

## 2022-12-24 MED ORDER — AMLODIPINE BESYLATE 5 MG PO TABS
5.0000 mg | ORAL_TABLET | Freq: Every day | ORAL | 3 refills | Status: AC
  Filled 2022-12-24: qty 30, 30d supply, fill #0
  Filled 2023-01-15 (×2): qty 30, 30d supply, fill #1

## 2022-12-24 MED ORDER — ESCITALOPRAM OXALATE 20 MG PO TABS
20.0000 mg | ORAL_TABLET | Freq: Every day | ORAL | 3 refills | Status: AC
  Filled 2022-12-24: qty 30, 30d supply, fill #0
  Filled 2023-01-15 (×2): qty 30, 30d supply, fill #1
  Filled 2023-03-14: qty 30, 30d supply, fill #2

## 2023-01-15 ENCOUNTER — Encounter (HOSPITAL_COMMUNITY): Payer: Self-pay

## 2023-01-15 ENCOUNTER — Other Ambulatory Visit (HOSPITAL_COMMUNITY): Payer: Self-pay

## 2023-03-15 ENCOUNTER — Other Ambulatory Visit: Payer: Self-pay

## 2023-03-15 ENCOUNTER — Other Ambulatory Visit (HOSPITAL_COMMUNITY): Payer: Self-pay

## 2023-04-16 ENCOUNTER — Other Ambulatory Visit (HOSPITAL_COMMUNITY): Payer: Self-pay

## 2023-04-16 ENCOUNTER — Other Ambulatory Visit: Payer: Self-pay

## 2023-04-16 DIAGNOSIS — F411 Generalized anxiety disorder: Secondary | ICD-10-CM | POA: Diagnosis not present

## 2023-04-16 DIAGNOSIS — I10 Essential (primary) hypertension: Secondary | ICD-10-CM | POA: Diagnosis not present

## 2023-04-16 DIAGNOSIS — G4700 Insomnia, unspecified: Secondary | ICD-10-CM | POA: Diagnosis not present

## 2023-04-16 MED ORDER — AMLODIPINE BESYLATE 5 MG PO TABS
5.0000 mg | ORAL_TABLET | Freq: Every day | ORAL | 5 refills | Status: AC
  Filled 2023-04-16 (×2): qty 30, 30d supply, fill #0
  Filled 2023-06-16: qty 30, 30d supply, fill #1
  Filled 2023-08-20: qty 30, 30d supply, fill #2
  Filled 2023-10-14: qty 30, 30d supply, fill #3
  Filled 2023-11-26: qty 60, 60d supply, fill #4

## 2023-04-16 MED ORDER — TRAZODONE HCL 50 MG PO TABS
50.0000 mg | ORAL_TABLET | Freq: Every evening | ORAL | 0 refills | Status: AC | PRN
  Filled 2023-04-16 (×2): qty 30, 30d supply, fill #0

## 2023-04-16 MED ORDER — ESCITALOPRAM OXALATE 20 MG PO TABS
20.0000 mg | ORAL_TABLET | Freq: Every day | ORAL | 5 refills | Status: AC
  Filled 2023-04-16 (×2): qty 30, 30d supply, fill #0
  Filled 2023-06-16: qty 30, 30d supply, fill #1
  Filled 2023-08-20: qty 30, 30d supply, fill #2
  Filled 2023-10-14: qty 30, 30d supply, fill #3
  Filled 2023-11-26: qty 60, 60d supply, fill #4

## 2023-04-24 DIAGNOSIS — M25511 Pain in right shoulder: Secondary | ICD-10-CM | POA: Diagnosis not present

## 2023-05-02 DIAGNOSIS — M25511 Pain in right shoulder: Secondary | ICD-10-CM | POA: Diagnosis not present

## 2023-05-06 ENCOUNTER — Other Ambulatory Visit (HOSPITAL_COMMUNITY): Payer: Self-pay

## 2023-05-06 MED ORDER — PREDNISONE 10 MG (48) PO TBPK
ORAL_TABLET | ORAL | 0 refills | Status: AC
  Filled 2023-05-06: qty 48, 12d supply, fill #0

## 2023-05-07 ENCOUNTER — Other Ambulatory Visit (HOSPITAL_COMMUNITY): Payer: Self-pay

## 2023-05-07 MED ORDER — IBUPROFEN 800 MG PO TABS
800.0000 mg | ORAL_TABLET | Freq: Three times a day (TID) | ORAL | 0 refills | Status: AC | PRN
  Filled 2023-05-07: qty 90, 30d supply, fill #0

## 2023-05-07 MED ORDER — CYCLOBENZAPRINE HCL 10 MG PO TABS
10.0000 mg | ORAL_TABLET | Freq: Two times a day (BID) | ORAL | 0 refills | Status: AC | PRN
  Filled 2023-05-07: qty 10, 5d supply, fill #0

## 2023-05-08 ENCOUNTER — Other Ambulatory Visit (HOSPITAL_COMMUNITY): Payer: Self-pay

## 2023-05-09 ENCOUNTER — Other Ambulatory Visit (HOSPITAL_COMMUNITY): Payer: Self-pay

## 2023-05-15 DIAGNOSIS — S46011A Strain of muscle(s) and tendon(s) of the rotator cuff of right shoulder, initial encounter: Secondary | ICD-10-CM | POA: Diagnosis not present

## 2023-05-29 ENCOUNTER — Other Ambulatory Visit (HOSPITAL_COMMUNITY): Payer: Self-pay

## 2023-05-29 MED ORDER — OXYCODONE HCL 5 MG PO TABS
5.0000 mg | ORAL_TABLET | Freq: Four times a day (QID) | ORAL | 0 refills | Status: AC | PRN
  Filled 2023-05-29: qty 28, 7d supply, fill #0

## 2023-05-29 MED ORDER — ACETAMINOPHEN EXTRA STRENGTH 500 MG PO TABS
1000.0000 mg | ORAL_TABLET | Freq: Four times a day (QID) | ORAL | 1 refills | Status: AC | PRN
  Filled 2023-05-29 (×2): qty 60, 8d supply, fill #0
  Filled 2023-06-20 (×3): qty 60, 8d supply, fill #1

## 2023-05-29 MED ORDER — ONDANSETRON 4 MG PO TBDP
4.0000 mg | ORAL_TABLET | Freq: Three times a day (TID) | ORAL | 0 refills | Status: AC | PRN
  Filled 2023-05-29: qty 15, 5d supply, fill #0

## 2023-05-29 MED ORDER — BACLOFEN 10 MG PO TABS
10.0000 mg | ORAL_TABLET | Freq: Three times a day (TID) | ORAL | 0 refills | Status: DC | PRN
  Filled 2023-05-29: qty 15, 5d supply, fill #0

## 2023-05-29 MED ORDER — MELOXICAM 15 MG PO TABS
15.0000 mg | ORAL_TABLET | Freq: Every day | ORAL | 2 refills | Status: AC | PRN
  Filled 2023-05-29: qty 30, 30d supply, fill #0

## 2023-05-29 MED ORDER — ASPIRIN 81 MG PO CHEW
81.0000 mg | CHEWABLE_TABLET | Freq: Two times a day (BID) | ORAL | 0 refills | Status: AC
  Filled 2023-05-29: qty 60, 30d supply, fill #0

## 2023-05-30 DIAGNOSIS — M7521 Bicipital tendinitis, right shoulder: Secondary | ICD-10-CM | POA: Diagnosis not present

## 2023-05-30 DIAGNOSIS — S43431A Superior glenoid labrum lesion of right shoulder, initial encounter: Secondary | ICD-10-CM | POA: Diagnosis not present

## 2023-05-30 DIAGNOSIS — Y999 Unspecified external cause status: Secondary | ICD-10-CM | POA: Diagnosis not present

## 2023-05-30 DIAGNOSIS — M7541 Impingement syndrome of right shoulder: Secondary | ICD-10-CM | POA: Diagnosis not present

## 2023-05-30 DIAGNOSIS — G8918 Other acute postprocedural pain: Secondary | ICD-10-CM | POA: Diagnosis not present

## 2023-05-30 DIAGNOSIS — X58XXXA Exposure to other specified factors, initial encounter: Secondary | ICD-10-CM | POA: Diagnosis not present

## 2023-05-30 DIAGNOSIS — M24111 Other articular cartilage disorders, right shoulder: Secondary | ICD-10-CM | POA: Diagnosis not present

## 2023-05-30 DIAGNOSIS — S46011A Strain of muscle(s) and tendon(s) of the rotator cuff of right shoulder, initial encounter: Secondary | ICD-10-CM | POA: Diagnosis not present

## 2023-05-30 DIAGNOSIS — M19011 Primary osteoarthritis, right shoulder: Secondary | ICD-10-CM | POA: Diagnosis not present

## 2023-06-03 DIAGNOSIS — S46011D Strain of muscle(s) and tendon(s) of the rotator cuff of right shoulder, subsequent encounter: Secondary | ICD-10-CM | POA: Diagnosis not present

## 2023-06-03 DIAGNOSIS — M7521 Bicipital tendinitis, right shoulder: Secondary | ICD-10-CM | POA: Diagnosis not present

## 2023-06-03 DIAGNOSIS — M25611 Stiffness of right shoulder, not elsewhere classified: Secondary | ICD-10-CM | POA: Diagnosis not present

## 2023-06-03 DIAGNOSIS — M6281 Muscle weakness (generalized): Secondary | ICD-10-CM | POA: Diagnosis not present

## 2023-06-05 DIAGNOSIS — S46011D Strain of muscle(s) and tendon(s) of the rotator cuff of right shoulder, subsequent encounter: Secondary | ICD-10-CM | POA: Diagnosis not present

## 2023-06-05 DIAGNOSIS — M25611 Stiffness of right shoulder, not elsewhere classified: Secondary | ICD-10-CM | POA: Diagnosis not present

## 2023-06-05 DIAGNOSIS — M7521 Bicipital tendinitis, right shoulder: Secondary | ICD-10-CM | POA: Diagnosis not present

## 2023-06-05 DIAGNOSIS — M6281 Muscle weakness (generalized): Secondary | ICD-10-CM | POA: Diagnosis not present

## 2023-06-10 DIAGNOSIS — M6281 Muscle weakness (generalized): Secondary | ICD-10-CM | POA: Diagnosis not present

## 2023-06-10 DIAGNOSIS — S46011D Strain of muscle(s) and tendon(s) of the rotator cuff of right shoulder, subsequent encounter: Secondary | ICD-10-CM | POA: Diagnosis not present

## 2023-06-10 DIAGNOSIS — M25611 Stiffness of right shoulder, not elsewhere classified: Secondary | ICD-10-CM | POA: Diagnosis not present

## 2023-06-10 DIAGNOSIS — M7521 Bicipital tendinitis, right shoulder: Secondary | ICD-10-CM | POA: Diagnosis not present

## 2023-06-14 DIAGNOSIS — M7521 Bicipital tendinitis, right shoulder: Secondary | ICD-10-CM | POA: Diagnosis not present

## 2023-06-14 DIAGNOSIS — M6281 Muscle weakness (generalized): Secondary | ICD-10-CM | POA: Diagnosis not present

## 2023-06-14 DIAGNOSIS — S46011D Strain of muscle(s) and tendon(s) of the rotator cuff of right shoulder, subsequent encounter: Secondary | ICD-10-CM | POA: Diagnosis not present

## 2023-06-14 DIAGNOSIS — M25611 Stiffness of right shoulder, not elsewhere classified: Secondary | ICD-10-CM | POA: Diagnosis not present

## 2023-06-17 ENCOUNTER — Other Ambulatory Visit (HOSPITAL_COMMUNITY): Payer: Self-pay

## 2023-06-18 ENCOUNTER — Other Ambulatory Visit: Payer: Self-pay

## 2023-06-18 DIAGNOSIS — M6281 Muscle weakness (generalized): Secondary | ICD-10-CM | POA: Diagnosis not present

## 2023-06-18 DIAGNOSIS — M7521 Bicipital tendinitis, right shoulder: Secondary | ICD-10-CM | POA: Diagnosis not present

## 2023-06-18 DIAGNOSIS — M25611 Stiffness of right shoulder, not elsewhere classified: Secondary | ICD-10-CM | POA: Diagnosis not present

## 2023-06-18 DIAGNOSIS — S46011D Strain of muscle(s) and tendon(s) of the rotator cuff of right shoulder, subsequent encounter: Secondary | ICD-10-CM | POA: Diagnosis not present

## 2023-06-20 ENCOUNTER — Other Ambulatory Visit: Payer: Self-pay

## 2023-06-20 ENCOUNTER — Other Ambulatory Visit (HOSPITAL_COMMUNITY): Payer: Self-pay

## 2023-06-20 DIAGNOSIS — M25611 Stiffness of right shoulder, not elsewhere classified: Secondary | ICD-10-CM | POA: Diagnosis not present

## 2023-06-20 DIAGNOSIS — S46011D Strain of muscle(s) and tendon(s) of the rotator cuff of right shoulder, subsequent encounter: Secondary | ICD-10-CM | POA: Diagnosis not present

## 2023-06-20 DIAGNOSIS — M7521 Bicipital tendinitis, right shoulder: Secondary | ICD-10-CM | POA: Diagnosis not present

## 2023-06-20 DIAGNOSIS — M6281 Muscle weakness (generalized): Secondary | ICD-10-CM | POA: Diagnosis not present

## 2023-06-20 MED ORDER — BACLOFEN 10 MG PO TABS
10.0000 mg | ORAL_TABLET | Freq: Three times a day (TID) | ORAL | 0 refills | Status: DC | PRN
  Filled 2023-06-20 (×2): qty 15, 5d supply, fill #0

## 2023-06-24 ENCOUNTER — Other Ambulatory Visit (HOSPITAL_COMMUNITY): Payer: Self-pay

## 2023-06-24 DIAGNOSIS — M25611 Stiffness of right shoulder, not elsewhere classified: Secondary | ICD-10-CM | POA: Diagnosis not present

## 2023-06-24 DIAGNOSIS — M6281 Muscle weakness (generalized): Secondary | ICD-10-CM | POA: Diagnosis not present

## 2023-06-24 DIAGNOSIS — S46011D Strain of muscle(s) and tendon(s) of the rotator cuff of right shoulder, subsequent encounter: Secondary | ICD-10-CM | POA: Diagnosis not present

## 2023-06-24 DIAGNOSIS — M7521 Bicipital tendinitis, right shoulder: Secondary | ICD-10-CM | POA: Diagnosis not present

## 2023-06-27 DIAGNOSIS — S46011D Strain of muscle(s) and tendon(s) of the rotator cuff of right shoulder, subsequent encounter: Secondary | ICD-10-CM | POA: Diagnosis not present

## 2023-06-27 DIAGNOSIS — M7521 Bicipital tendinitis, right shoulder: Secondary | ICD-10-CM | POA: Diagnosis not present

## 2023-06-27 DIAGNOSIS — M25611 Stiffness of right shoulder, not elsewhere classified: Secondary | ICD-10-CM | POA: Diagnosis not present

## 2023-06-27 DIAGNOSIS — M6281 Muscle weakness (generalized): Secondary | ICD-10-CM | POA: Diagnosis not present

## 2023-07-01 DIAGNOSIS — M6281 Muscle weakness (generalized): Secondary | ICD-10-CM | POA: Diagnosis not present

## 2023-07-01 DIAGNOSIS — S46011D Strain of muscle(s) and tendon(s) of the rotator cuff of right shoulder, subsequent encounter: Secondary | ICD-10-CM | POA: Diagnosis not present

## 2023-07-01 DIAGNOSIS — M7521 Bicipital tendinitis, right shoulder: Secondary | ICD-10-CM | POA: Diagnosis not present

## 2023-07-01 DIAGNOSIS — M25611 Stiffness of right shoulder, not elsewhere classified: Secondary | ICD-10-CM | POA: Diagnosis not present

## 2023-07-04 DIAGNOSIS — M6281 Muscle weakness (generalized): Secondary | ICD-10-CM | POA: Diagnosis not present

## 2023-07-04 DIAGNOSIS — S46011D Strain of muscle(s) and tendon(s) of the rotator cuff of right shoulder, subsequent encounter: Secondary | ICD-10-CM | POA: Diagnosis not present

## 2023-07-04 DIAGNOSIS — M7521 Bicipital tendinitis, right shoulder: Secondary | ICD-10-CM | POA: Diagnosis not present

## 2023-07-04 DIAGNOSIS — M25611 Stiffness of right shoulder, not elsewhere classified: Secondary | ICD-10-CM | POA: Diagnosis not present

## 2023-07-08 DIAGNOSIS — S46011D Strain of muscle(s) and tendon(s) of the rotator cuff of right shoulder, subsequent encounter: Secondary | ICD-10-CM | POA: Diagnosis not present

## 2023-07-08 DIAGNOSIS — M6281 Muscle weakness (generalized): Secondary | ICD-10-CM | POA: Diagnosis not present

## 2023-07-08 DIAGNOSIS — M7521 Bicipital tendinitis, right shoulder: Secondary | ICD-10-CM | POA: Diagnosis not present

## 2023-07-08 DIAGNOSIS — M25611 Stiffness of right shoulder, not elsewhere classified: Secondary | ICD-10-CM | POA: Diagnosis not present

## 2023-07-11 DIAGNOSIS — M6281 Muscle weakness (generalized): Secondary | ICD-10-CM | POA: Diagnosis not present

## 2023-07-11 DIAGNOSIS — M7521 Bicipital tendinitis, right shoulder: Secondary | ICD-10-CM | POA: Diagnosis not present

## 2023-07-11 DIAGNOSIS — M25611 Stiffness of right shoulder, not elsewhere classified: Secondary | ICD-10-CM | POA: Diagnosis not present

## 2023-07-11 DIAGNOSIS — S46011D Strain of muscle(s) and tendon(s) of the rotator cuff of right shoulder, subsequent encounter: Secondary | ICD-10-CM | POA: Diagnosis not present

## 2023-07-15 DIAGNOSIS — M6281 Muscle weakness (generalized): Secondary | ICD-10-CM | POA: Diagnosis not present

## 2023-07-15 DIAGNOSIS — M25611 Stiffness of right shoulder, not elsewhere classified: Secondary | ICD-10-CM | POA: Diagnosis not present

## 2023-07-15 DIAGNOSIS — M7521 Bicipital tendinitis, right shoulder: Secondary | ICD-10-CM | POA: Diagnosis not present

## 2023-07-15 DIAGNOSIS — S46011D Strain of muscle(s) and tendon(s) of the rotator cuff of right shoulder, subsequent encounter: Secondary | ICD-10-CM | POA: Diagnosis not present

## 2023-07-18 DIAGNOSIS — M25611 Stiffness of right shoulder, not elsewhere classified: Secondary | ICD-10-CM | POA: Diagnosis not present

## 2023-07-18 DIAGNOSIS — M7521 Bicipital tendinitis, right shoulder: Secondary | ICD-10-CM | POA: Diagnosis not present

## 2023-07-18 DIAGNOSIS — S46011D Strain of muscle(s) and tendon(s) of the rotator cuff of right shoulder, subsequent encounter: Secondary | ICD-10-CM | POA: Diagnosis not present

## 2023-07-18 DIAGNOSIS — M6281 Muscle weakness (generalized): Secondary | ICD-10-CM | POA: Diagnosis not present

## 2023-07-22 DIAGNOSIS — M25611 Stiffness of right shoulder, not elsewhere classified: Secondary | ICD-10-CM | POA: Diagnosis not present

## 2023-07-22 DIAGNOSIS — M7521 Bicipital tendinitis, right shoulder: Secondary | ICD-10-CM | POA: Diagnosis not present

## 2023-07-22 DIAGNOSIS — M6281 Muscle weakness (generalized): Secondary | ICD-10-CM | POA: Diagnosis not present

## 2023-07-22 DIAGNOSIS — S46011D Strain of muscle(s) and tendon(s) of the rotator cuff of right shoulder, subsequent encounter: Secondary | ICD-10-CM | POA: Diagnosis not present

## 2023-07-25 DIAGNOSIS — S46011D Strain of muscle(s) and tendon(s) of the rotator cuff of right shoulder, subsequent encounter: Secondary | ICD-10-CM | POA: Diagnosis not present

## 2023-07-25 DIAGNOSIS — M6281 Muscle weakness (generalized): Secondary | ICD-10-CM | POA: Diagnosis not present

## 2023-07-25 DIAGNOSIS — M25611 Stiffness of right shoulder, not elsewhere classified: Secondary | ICD-10-CM | POA: Diagnosis not present

## 2023-07-25 DIAGNOSIS — M7521 Bicipital tendinitis, right shoulder: Secondary | ICD-10-CM | POA: Diagnosis not present

## 2023-07-29 DIAGNOSIS — M6281 Muscle weakness (generalized): Secondary | ICD-10-CM | POA: Diagnosis not present

## 2023-07-29 DIAGNOSIS — M25611 Stiffness of right shoulder, not elsewhere classified: Secondary | ICD-10-CM | POA: Diagnosis not present

## 2023-07-29 DIAGNOSIS — M7521 Bicipital tendinitis, right shoulder: Secondary | ICD-10-CM | POA: Diagnosis not present

## 2023-07-29 DIAGNOSIS — S46011D Strain of muscle(s) and tendon(s) of the rotator cuff of right shoulder, subsequent encounter: Secondary | ICD-10-CM | POA: Diagnosis not present

## 2023-08-01 DIAGNOSIS — M7521 Bicipital tendinitis, right shoulder: Secondary | ICD-10-CM | POA: Diagnosis not present

## 2023-08-01 DIAGNOSIS — M6281 Muscle weakness (generalized): Secondary | ICD-10-CM | POA: Diagnosis not present

## 2023-08-01 DIAGNOSIS — M25611 Stiffness of right shoulder, not elsewhere classified: Secondary | ICD-10-CM | POA: Diagnosis not present

## 2023-08-01 DIAGNOSIS — S46011D Strain of muscle(s) and tendon(s) of the rotator cuff of right shoulder, subsequent encounter: Secondary | ICD-10-CM | POA: Diagnosis not present

## 2023-08-05 DIAGNOSIS — M25611 Stiffness of right shoulder, not elsewhere classified: Secondary | ICD-10-CM | POA: Diagnosis not present

## 2023-08-05 DIAGNOSIS — M7521 Bicipital tendinitis, right shoulder: Secondary | ICD-10-CM | POA: Diagnosis not present

## 2023-08-05 DIAGNOSIS — S46011D Strain of muscle(s) and tendon(s) of the rotator cuff of right shoulder, subsequent encounter: Secondary | ICD-10-CM | POA: Diagnosis not present

## 2023-08-05 DIAGNOSIS — M6281 Muscle weakness (generalized): Secondary | ICD-10-CM | POA: Diagnosis not present

## 2023-08-12 DIAGNOSIS — S46011D Strain of muscle(s) and tendon(s) of the rotator cuff of right shoulder, subsequent encounter: Secondary | ICD-10-CM | POA: Diagnosis not present

## 2023-08-12 DIAGNOSIS — M7521 Bicipital tendinitis, right shoulder: Secondary | ICD-10-CM | POA: Diagnosis not present

## 2023-08-12 DIAGNOSIS — M6281 Muscle weakness (generalized): Secondary | ICD-10-CM | POA: Diagnosis not present

## 2023-08-12 DIAGNOSIS — M25611 Stiffness of right shoulder, not elsewhere classified: Secondary | ICD-10-CM | POA: Diagnosis not present

## 2023-08-15 ENCOUNTER — Other Ambulatory Visit (HOSPITAL_COMMUNITY): Payer: Self-pay

## 2023-08-15 DIAGNOSIS — M7521 Bicipital tendinitis, right shoulder: Secondary | ICD-10-CM | POA: Diagnosis not present

## 2023-08-15 DIAGNOSIS — S46011D Strain of muscle(s) and tendon(s) of the rotator cuff of right shoulder, subsequent encounter: Secondary | ICD-10-CM | POA: Diagnosis not present

## 2023-08-15 DIAGNOSIS — M6281 Muscle weakness (generalized): Secondary | ICD-10-CM | POA: Diagnosis not present

## 2023-08-15 DIAGNOSIS — M25611 Stiffness of right shoulder, not elsewhere classified: Secondary | ICD-10-CM | POA: Diagnosis not present

## 2023-08-16 ENCOUNTER — Other Ambulatory Visit (HOSPITAL_COMMUNITY): Payer: Self-pay

## 2023-08-16 MED ORDER — BACLOFEN 10 MG PO TABS
10.0000 mg | ORAL_TABLET | Freq: Three times a day (TID) | ORAL | 0 refills | Status: AC | PRN
  Filled 2023-08-16 (×2): qty 15, 5d supply, fill #0

## 2023-08-19 ENCOUNTER — Other Ambulatory Visit: Payer: Self-pay

## 2023-08-19 DIAGNOSIS — M6281 Muscle weakness (generalized): Secondary | ICD-10-CM | POA: Diagnosis not present

## 2023-08-19 DIAGNOSIS — M7521 Bicipital tendinitis, right shoulder: Secondary | ICD-10-CM | POA: Diagnosis not present

## 2023-08-19 DIAGNOSIS — M25611 Stiffness of right shoulder, not elsewhere classified: Secondary | ICD-10-CM | POA: Diagnosis not present

## 2023-08-19 DIAGNOSIS — S46011D Strain of muscle(s) and tendon(s) of the rotator cuff of right shoulder, subsequent encounter: Secondary | ICD-10-CM | POA: Diagnosis not present

## 2023-08-21 ENCOUNTER — Other Ambulatory Visit: Payer: Self-pay

## 2023-08-22 DIAGNOSIS — M6281 Muscle weakness (generalized): Secondary | ICD-10-CM | POA: Diagnosis not present

## 2023-08-22 DIAGNOSIS — S46011D Strain of muscle(s) and tendon(s) of the rotator cuff of right shoulder, subsequent encounter: Secondary | ICD-10-CM | POA: Diagnosis not present

## 2023-08-22 DIAGNOSIS — M25611 Stiffness of right shoulder, not elsewhere classified: Secondary | ICD-10-CM | POA: Diagnosis not present

## 2023-08-22 DIAGNOSIS — M7521 Bicipital tendinitis, right shoulder: Secondary | ICD-10-CM | POA: Diagnosis not present

## 2023-08-26 DIAGNOSIS — M7521 Bicipital tendinitis, right shoulder: Secondary | ICD-10-CM | POA: Diagnosis not present

## 2023-08-26 DIAGNOSIS — S46011D Strain of muscle(s) and tendon(s) of the rotator cuff of right shoulder, subsequent encounter: Secondary | ICD-10-CM | POA: Diagnosis not present

## 2023-08-26 DIAGNOSIS — M25611 Stiffness of right shoulder, not elsewhere classified: Secondary | ICD-10-CM | POA: Diagnosis not present

## 2023-08-26 DIAGNOSIS — M6281 Muscle weakness (generalized): Secondary | ICD-10-CM | POA: Diagnosis not present

## 2023-09-02 DIAGNOSIS — S46011D Strain of muscle(s) and tendon(s) of the rotator cuff of right shoulder, subsequent encounter: Secondary | ICD-10-CM | POA: Diagnosis not present

## 2023-09-02 DIAGNOSIS — M25611 Stiffness of right shoulder, not elsewhere classified: Secondary | ICD-10-CM | POA: Diagnosis not present

## 2023-09-02 DIAGNOSIS — M7521 Bicipital tendinitis, right shoulder: Secondary | ICD-10-CM | POA: Diagnosis not present

## 2023-09-02 DIAGNOSIS — M6281 Muscle weakness (generalized): Secondary | ICD-10-CM | POA: Diagnosis not present

## 2023-09-06 DIAGNOSIS — M7501 Adhesive capsulitis of right shoulder: Secondary | ICD-10-CM | POA: Diagnosis not present

## 2023-09-11 DIAGNOSIS — M25611 Stiffness of right shoulder, not elsewhere classified: Secondary | ICD-10-CM | POA: Diagnosis not present

## 2023-09-11 DIAGNOSIS — M7521 Bicipital tendinitis, right shoulder: Secondary | ICD-10-CM | POA: Diagnosis not present

## 2023-09-11 DIAGNOSIS — M6281 Muscle weakness (generalized): Secondary | ICD-10-CM | POA: Diagnosis not present

## 2023-09-11 DIAGNOSIS — S46011D Strain of muscle(s) and tendon(s) of the rotator cuff of right shoulder, subsequent encounter: Secondary | ICD-10-CM | POA: Diagnosis not present

## 2023-09-16 DIAGNOSIS — M25611 Stiffness of right shoulder, not elsewhere classified: Secondary | ICD-10-CM | POA: Diagnosis not present

## 2023-09-16 DIAGNOSIS — M6281 Muscle weakness (generalized): Secondary | ICD-10-CM | POA: Diagnosis not present

## 2023-09-16 DIAGNOSIS — S46011D Strain of muscle(s) and tendon(s) of the rotator cuff of right shoulder, subsequent encounter: Secondary | ICD-10-CM | POA: Diagnosis not present

## 2023-09-16 DIAGNOSIS — M7521 Bicipital tendinitis, right shoulder: Secondary | ICD-10-CM | POA: Diagnosis not present

## 2023-09-24 DIAGNOSIS — M6281 Muscle weakness (generalized): Secondary | ICD-10-CM | POA: Diagnosis not present

## 2023-09-24 DIAGNOSIS — M25611 Stiffness of right shoulder, not elsewhere classified: Secondary | ICD-10-CM | POA: Diagnosis not present

## 2023-09-24 DIAGNOSIS — M7521 Bicipital tendinitis, right shoulder: Secondary | ICD-10-CM | POA: Diagnosis not present

## 2023-09-24 DIAGNOSIS — S46011D Strain of muscle(s) and tendon(s) of the rotator cuff of right shoulder, subsequent encounter: Secondary | ICD-10-CM | POA: Diagnosis not present

## 2023-10-04 DIAGNOSIS — M7521 Bicipital tendinitis, right shoulder: Secondary | ICD-10-CM | POA: Diagnosis not present

## 2023-10-04 DIAGNOSIS — S46011D Strain of muscle(s) and tendon(s) of the rotator cuff of right shoulder, subsequent encounter: Secondary | ICD-10-CM | POA: Diagnosis not present

## 2023-10-04 DIAGNOSIS — M6281 Muscle weakness (generalized): Secondary | ICD-10-CM | POA: Diagnosis not present

## 2023-10-04 DIAGNOSIS — M25611 Stiffness of right shoulder, not elsewhere classified: Secondary | ICD-10-CM | POA: Diagnosis not present

## 2023-10-11 DIAGNOSIS — S46011D Strain of muscle(s) and tendon(s) of the rotator cuff of right shoulder, subsequent encounter: Secondary | ICD-10-CM | POA: Diagnosis not present

## 2023-10-11 DIAGNOSIS — M7521 Bicipital tendinitis, right shoulder: Secondary | ICD-10-CM | POA: Diagnosis not present

## 2023-10-11 DIAGNOSIS — M25611 Stiffness of right shoulder, not elsewhere classified: Secondary | ICD-10-CM | POA: Diagnosis not present

## 2023-10-11 DIAGNOSIS — M6281 Muscle weakness (generalized): Secondary | ICD-10-CM | POA: Diagnosis not present

## 2023-10-14 ENCOUNTER — Other Ambulatory Visit (HOSPITAL_COMMUNITY): Payer: Self-pay

## 2023-10-16 DIAGNOSIS — E8889 Other specified metabolic disorders: Secondary | ICD-10-CM | POA: Diagnosis not present

## 2023-10-16 DIAGNOSIS — R6889 Other general symptoms and signs: Secondary | ICD-10-CM | POA: Diagnosis not present

## 2023-10-16 DIAGNOSIS — R635 Abnormal weight gain: Secondary | ICD-10-CM | POA: Diagnosis not present

## 2023-10-16 DIAGNOSIS — E559 Vitamin D deficiency, unspecified: Secondary | ICD-10-CM | POA: Diagnosis not present

## 2023-10-16 DIAGNOSIS — I10 Essential (primary) hypertension: Secondary | ICD-10-CM | POA: Diagnosis not present

## 2023-10-16 DIAGNOSIS — Z1331 Encounter for screening for depression: Secondary | ICD-10-CM | POA: Diagnosis not present

## 2023-10-29 DIAGNOSIS — I10 Essential (primary) hypertension: Secondary | ICD-10-CM | POA: Diagnosis not present

## 2023-10-29 DIAGNOSIS — E559 Vitamin D deficiency, unspecified: Secondary | ICD-10-CM | POA: Diagnosis not present

## 2023-10-29 DIAGNOSIS — R948 Abnormal results of function studies of other organs and systems: Secondary | ICD-10-CM | POA: Diagnosis not present

## 2023-10-29 DIAGNOSIS — R635 Abnormal weight gain: Secondary | ICD-10-CM | POA: Diagnosis not present

## 2023-11-08 DIAGNOSIS — M25311 Other instability, right shoulder: Secondary | ICD-10-CM | POA: Diagnosis not present

## 2023-11-08 DIAGNOSIS — Z4789 Encounter for other orthopedic aftercare: Secondary | ICD-10-CM | POA: Diagnosis not present

## 2023-11-08 DIAGNOSIS — M7521 Bicipital tendinitis, right shoulder: Secondary | ICD-10-CM | POA: Diagnosis not present

## 2023-11-08 DIAGNOSIS — M25611 Stiffness of right shoulder, not elsewhere classified: Secondary | ICD-10-CM | POA: Diagnosis not present

## 2023-11-18 DIAGNOSIS — M25511 Pain in right shoulder: Secondary | ICD-10-CM | POA: Diagnosis not present

## 2023-11-20 DIAGNOSIS — H903 Sensorineural hearing loss, bilateral: Secondary | ICD-10-CM | POA: Diagnosis not present

## 2023-11-26 ENCOUNTER — Other Ambulatory Visit (HOSPITAL_COMMUNITY): Payer: Self-pay

## 2023-11-26 DIAGNOSIS — F411 Generalized anxiety disorder: Secondary | ICD-10-CM | POA: Diagnosis not present

## 2023-11-26 DIAGNOSIS — E538 Deficiency of other specified B group vitamins: Secondary | ICD-10-CM | POA: Diagnosis not present

## 2023-11-26 DIAGNOSIS — Z131 Encounter for screening for diabetes mellitus: Secondary | ICD-10-CM | POA: Diagnosis not present

## 2023-11-26 DIAGNOSIS — Z23 Encounter for immunization: Secondary | ICD-10-CM | POA: Diagnosis not present

## 2023-11-26 DIAGNOSIS — Z0000 Encounter for general adult medical examination without abnormal findings: Secondary | ICD-10-CM | POA: Diagnosis not present

## 2023-11-26 DIAGNOSIS — I10 Essential (primary) hypertension: Secondary | ICD-10-CM | POA: Diagnosis not present

## 2023-11-26 DIAGNOSIS — G3184 Mild cognitive impairment, so stated: Secondary | ICD-10-CM | POA: Diagnosis not present

## 2023-11-26 DIAGNOSIS — Z13220 Encounter for screening for lipoid disorders: Secondary | ICD-10-CM | POA: Diagnosis not present

## 2023-11-28 ENCOUNTER — Other Ambulatory Visit (HOSPITAL_COMMUNITY): Payer: Self-pay

## 2023-11-28 DIAGNOSIS — E559 Vitamin D deficiency, unspecified: Secondary | ICD-10-CM | POA: Diagnosis not present

## 2023-11-28 DIAGNOSIS — R948 Abnormal results of function studies of other organs and systems: Secondary | ICD-10-CM | POA: Diagnosis not present

## 2023-11-28 DIAGNOSIS — R635 Abnormal weight gain: Secondary | ICD-10-CM | POA: Diagnosis not present

## 2023-11-28 MED ORDER — BUPROPION HCL ER (XL) 150 MG PO TB24
150.0000 mg | ORAL_TABLET | Freq: Every morning | ORAL | 2 refills | Status: AC
  Filled 2023-11-28: qty 30, 30d supply, fill #0
  Filled 2023-12-26: qty 30, 30d supply, fill #1

## 2023-12-12 DIAGNOSIS — Z1231 Encounter for screening mammogram for malignant neoplasm of breast: Secondary | ICD-10-CM | POA: Diagnosis not present

## 2023-12-16 DIAGNOSIS — R203 Hyperesthesia: Secondary | ICD-10-CM | POA: Diagnosis not present

## 2023-12-16 DIAGNOSIS — M25511 Pain in right shoulder: Secondary | ICD-10-CM | POA: Diagnosis not present

## 2023-12-26 ENCOUNTER — Other Ambulatory Visit (HOSPITAL_COMMUNITY): Payer: Self-pay

## 2024-01-02 IMAGING — CR DG SHOULDER 2+V*L*
2 series · 2 of 2 positions shown · non-contrast
Comparison: None.

CLINICAL DATA: Anterior left shoulder pain after shoulder injury
yesterday. Pulled a laundry cart through a door and pulled left
shoulder.

EXAM:
LEFT SHOULDER - 2+ VIEW

[w shoulder external left]
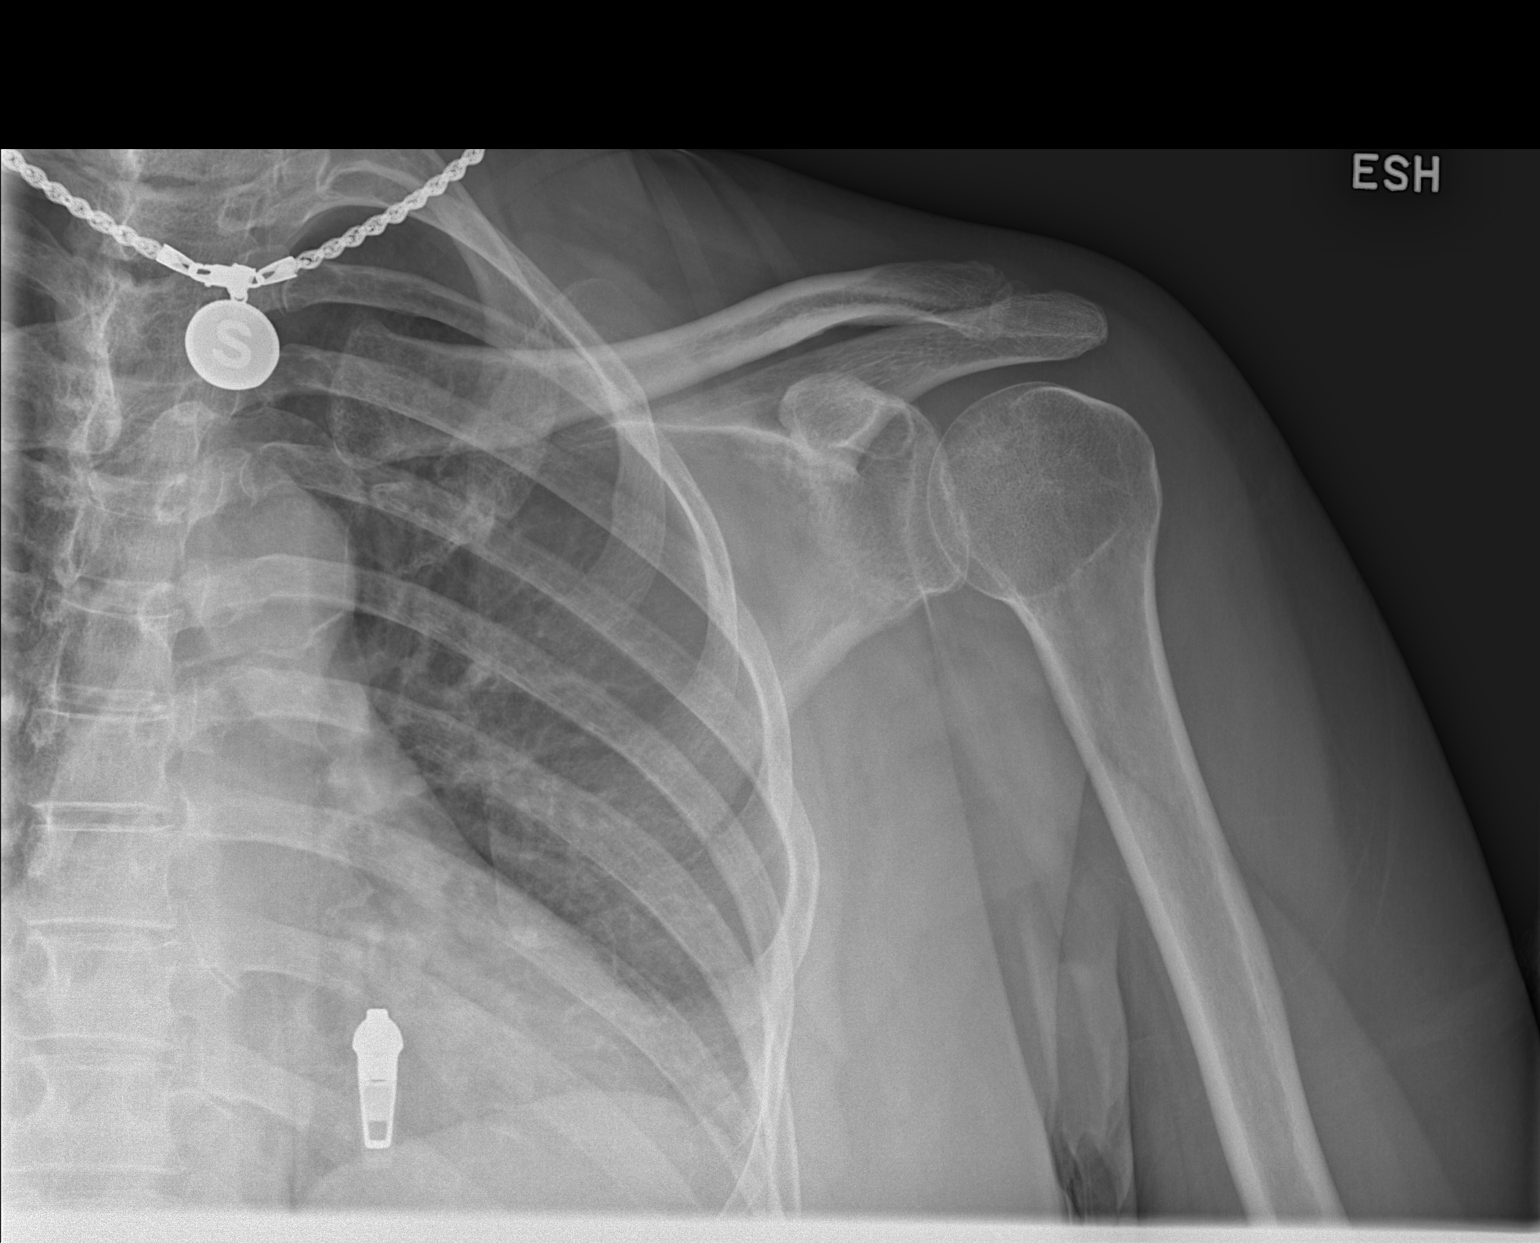

[w shoulder y-view left]
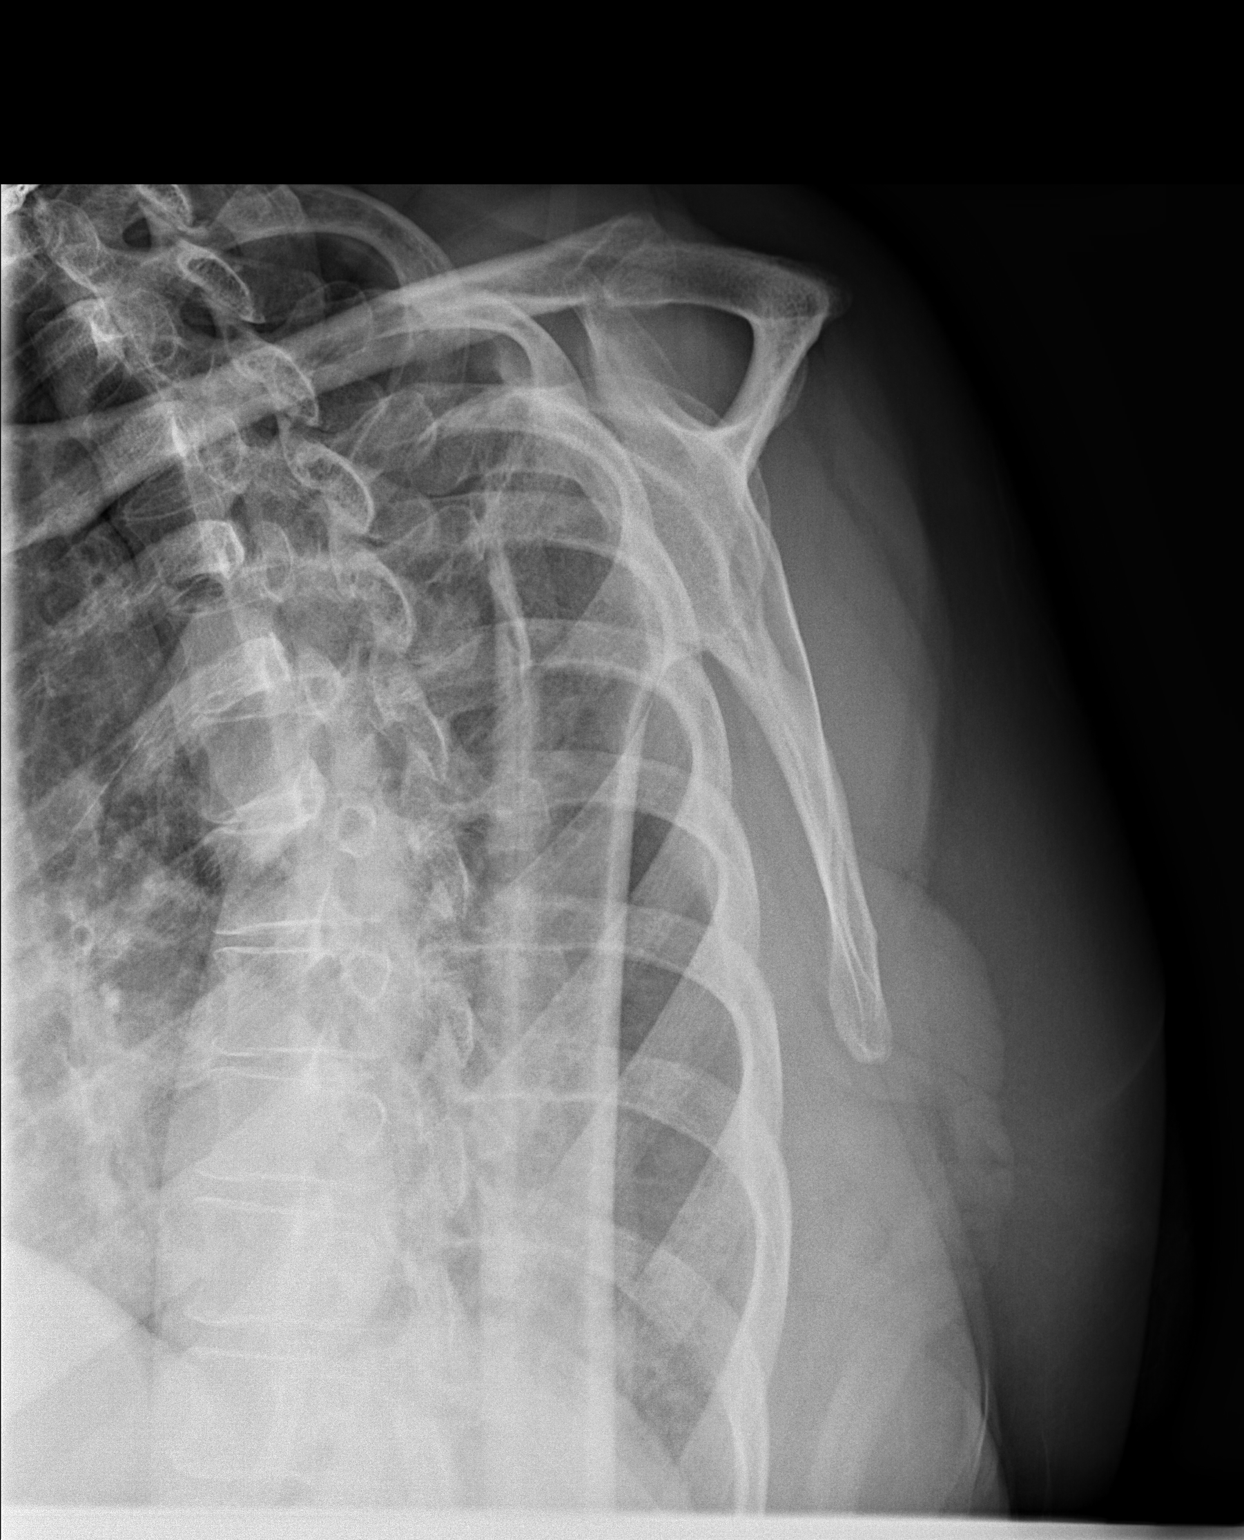

[2 of 2 positions shown; findings below may reference images not displayed]

FINDINGS: Normal bone mineralization. The glenohumeral and acromioclavicular
joint spaces are normally aligned. No significant osteoarthritis. No
acute fracture or dislocation. The visualized portion of the left
lung is unremarkable.
IMPRESSION: Normal left shoulder radiographs.

## 2024-02-01 ENCOUNTER — Other Ambulatory Visit: Payer: Self-pay

## 2024-02-20 ENCOUNTER — Telehealth: Admitting: Medical Genetics

## 2024-02-20 DIAGNOSIS — Z820 Family history of epilepsy and other diseases of the nervous system: Secondary | ICD-10-CM | POA: Insufficient documentation

## 2024-02-20 DIAGNOSIS — F411 Generalized anxiety disorder: Secondary | ICD-10-CM | POA: Insufficient documentation

## 2024-02-20 DIAGNOSIS — R413 Other amnesia: Secondary | ICD-10-CM | POA: Insufficient documentation

## 2024-02-20 DIAGNOSIS — I10 Essential (primary) hypertension: Secondary | ICD-10-CM

## 2024-02-20 NOTE — Progress Notes (Signed)
 "   MEDICAL GENETICS TELEMEDICINE VISIT   This is a [employer] E-Visit  provided via [employer]. [name] consented to an E-Visit consult today.  Location of patient: at home in [place], [place] Location of provider: [facility]   The following participants were involved in this E-Visit:  Dr. Chad Haldeman Englert (Medical Geneticist) Maddy Joshua (Genetic Counselor) Cloretta Candy   Total time on video call: 26 minutes Total time on phone call: 15 minutes  Patient name: Tracey Church DOB: [birth year] Age: 59 y.o. MRN: [id number]  Referring Provider/Specialty: Latrelle Holt, PA / PCP Date of Evaluation: 02/20/2024 Chief Complaint/Reason for Referral: Personal and family history of memory loss  Brief Summary: Tracey Church is a 59 y.o. female who presents today for an initial genetics evaluation for a personal and family history of memory loss.   Prior genetic testing has not been performed.  Family History: See pedigree obtained during today's visit under History->Family->Pedigree.  The family history was notable for the following:  Paternal Family History  Father, deceased at 84 yo, from prostate cancer.  Also with dementia diagnosed in his 23s.  Maternal Family History Mother, deceased in her 40s, from complications related to Alzheimer disease. Half-brother, 43 yo, alive and well. Half-sister, 36 yo, with ovarian cancer in her 30s or 69s, s/p oophorectomy. Half-brother, deceased in his 67s, from lung cancer with a history of smoking. Half-sister, deceased at 84 yo, from complication during surgery for a heart valve replacement.  Mother's ethnicity: African American, Mixed European Father's ethnicity: African American Consangunity: Denies  Prior Genetic testing: None  Genetic Counseling: Analissa Bayless is a 59 y.o. female with a personal and family history of memory loss.  For detailed HPI, please see accompanying note from Dr. Chad Haldeman  Englert.  Tracey Church mother was diagnosed with Alzheimer disease in her 83s and passed from complications related to Alzheimer disease in her 66s.  Her father was diagnosed with early -onset dementia in his 85s and passed at 57 yo from prostate cancer.  There is a maternal first cousin, in her 11s, with dementia diagnosed in her 73s. There is limited additional family history information available.  Tracey Church is not aware of other individuals in the family with dementia or Alzheimer disease.  We discussed that early-onset Alzheimer disease is sometimes caused by a genetic change in the APP, PSEN1, or PSEN2 genes.  Given her mother and father's early age at diagnosis of Alzheimer disease and dementia, it is reasonable to consider testing of these genes in Tracey Church herself to better understand her risk for these conditions.  We also discussed the APOE gene and its relationship with Alzheimer disease risk. Genotyping of this gene is also recommended to better understand Tracey Church's personal risk.  Results from testing will have important impacts on medical management, prognosis, and recurrence risk.  Tracey Church was agreeable with this plan and verbal consent was provided after discussion of the types of results, expected cost and timeline, implication on insurance policies, risks, benefits, and limitations.  Orders for APOE Genotyping and Early-Onset Alzheimer's Panel have been placed to [facility] and a copy of the order was provided to Ms. [name] via email.  Results are expected within 2-3 weeks of sample collection, at which point we will reach out with further information.  Recommendations: [facility] APOE Genotyping [facility] Early-Onset Alzheimer's Panel Continue follow-up with other healthcare providers as recommended  Date: 02/20/2024 Total time spent: 60 minutes Genetic Counselor-only Time: 30 minutes  [employer]  Joshua MS [facility] Certified Genetic Counselor [facility]  I have  personally counseled the patient/family, spending > 50% of total time on counseling and coordination of care as outlined.  "

## 2024-02-20 NOTE — Progress Notes (Signed)
 "   MEDICAL GENETICS NEW PATIENT EVALUATION  Patient name: Tracey Church DOB: [birth year] Age: 59 y.o. MRN: [id number]  Referring Provider/Specialty: Latrelle Holt, PA  Date of Evaluation: 02/20/2024 Chief Complaint/Reason for Referral: Memory concerns, family history of dementia  I connected with  Tracey Church on 02/20/24 by a video enabled telemedicine application and verified that I am speaking with the correct person using two identifiers.   I discussed the limitations of evaluation and management by telemedicine. The patient expressed understanding and agreed to proceed.  The patient and provider were both in [place] .  Assessment: We discussed with Tracey Church that given her family history and current concern for memory issues that genetic testing for earlier forms of Alzheimer disease is recommended. This testing would include the APP, PSEN1, PSEN2, and APOE genes. Tracey Church was interested in this being performed, and orders will be placed for testing through [facility]. Results are expected in 3-4 weeks, and we will contact her when they are available.  We also discussed various lifestyle factors that Tracey Church can do (and continue to do) related to reducing future risk of dementia, including appropriate sleep, managing blood pressure, exercising, not smoking, healthy diet, and managing stress.  Recommendations: APP, PSEN1, PSEN2, and APOE testing through [facility] - results expected in 3-4 weeks. Continue follow up with current medical providers per their recommendations. Lifestyle modifications to include appropriate sleep, managing blood pressure, exercising, not smoking, healthy diet, and managing stress.  Follow up will be based on the results of the testing.    HPI: Tracey Church is a 59 y.o. assigned female at birth who presents today for an initial genetics evaluation for memory concerns and family history of Alzheimer disease. She provided the history. This  information, along with a review of pertinent records, labs, and radiology studies, is summarized below.  Tracey Church has a family history of Alzheimer disease in her mother and father. Her mother passed away in her 30s related to Alzheimer disease, at which time Tracey Church was a teenager. Over the past year, Tracey Church has felt that her memory has worsened, mostly short-term memory. She feels that her health is generally good, including her sleep. She takes medication for hypertension. She does not smoke or drink. She exercises periodically; she fell last year and tore her rotator cuff and triceps and was recently cleared to increase her exercise regimen.  Past Medical History: Patient Active Problem List   Diagnosis Date Noted   Generalized anxiety disorder 02/20/2024   Family history of Alzheimer's disease 02/20/2024   Hypertension 01/31/2020   Rotator cuff arthropathy of left shoulder 01/31/2020   Sacroiliac joint dysfunction of right side 02/24/2016   Spondylosis of lumbar region without myelopathy or radiculopathy 10/27/2015   Bertolotti's syndrome 10/06/2015   Lumbar radiculitis 10/06/2015   Medications: Medications Ordered Prior to Encounter[1]  Allergies:  Allergies[2]  Review of Systems: Negative except as noted in the HPI  Family History: Paternal Family History  Father, deceased at 48 yo, from prostate cancer.  Also with dementia diagnosed in his 41s.   Maternal Family History Mother, deceased in her 46s, from complications related to Alzheimer disease. Half-brother, 60 yo, alive and well. Half-sister, 54 yo, with ovarian cancer in her 30s or 24s, s/p oophorectomy. Half-brother, deceased in his 41s, from lung cancer with a history of smoking. Half-sister, deceased at 61 yo, from complication during surgery for a heart valve replacement.   Mother's ethnicity: African American, Mixed European Father's ethnicity: African American  Consangunity: Denies Please see the  genetic counselor note for additional information  Social History: Lives by herself in [place] Physical Exam  Limited exam due to video visit Normal appearing neurological exam   Annalissa Murphey Haldeman-Englert, MD [facility]/Genetics Date: 02/20/2024 Time: 0915   Total time spent: 40 minutes Time spent includes face to face and non-face to face care for the patient on the date of this encounter (history and physical, genetic counseling, coordination of care, data gathering and/or documentation as outlined).  Genetic counselor: Lum Molt, MS, CGC     [1]  Current Outpatient Medications on File Prior to Visit  Medication Sig Dispense Refill   Acetaminophen  Extra Strength 500 MG TABS Take 2 tablets (1,000 mg total) by mouth every 6 (six) hours as needed for pain. 60 tablet 1   amLODipine  (NORVASC ) 10 MG tablet Take 1 tablet (10 mg total) by mouth daily. 90 tablet 1   amLODipine  (NORVASC ) 5 MG tablet Take 1 tablet (5 mg total) by mouth daily. 30 tablet 3   amLODipine  (NORVASC ) 5 MG tablet Take 1 tablet (5 mg total) by mouth daily. 30 tablet 3   amLODipine  (NORVASC ) 5 MG tablet Take 1 tablet (5 mg total) by mouth daily. 30 tablet 5   amoxicillin  (AMOXIL ) 500 MG capsule Take 1 capsule (500 mg total) by mouth 4 (four) times daily. 28 capsule 0   aspirin  81 MG chewable tablet Chew 1 tablet (81 mg total) by mouth 2 (two) times daily to prevent blood clots after surgery. 60 tablet 0   baclofen  (LIORESAL ) 10 MG tablet Take 1 tablet (10 mg total) by mouth every 8 (eight) hours as needed for muscle spasms 15 tablet 0   buPROPion  (WELLBUTRIN  XL) 150 MG 24 hr tablet Take 1 tablet (150 mg total) by mouth every morning. 30 tablet 2   cyclobenzaprine  (FLEXERIL ) 10 MG tablet Take 1 tablet (10 mg total) by mouth 2 (two) times daily as needed for muscle spasms and pain. 10 tablet 0   D3-50 1.25 MG (50000 UT) capsule Take 50,000 Units by mouth once a week.     diclofenac  (VOLTAREN ) 75 MG  EC tablet Take 1 tablet (75 mg total) by mouth 2 (two) times daily with a meal. 60 tablet 1   escitalopram  (LEXAPRO ) 10 MG tablet Take 1 tablet (10 mg total) by mouth daily for 14 days, THEN 2 tablets (20 mg total) daily for 14 days. 32 tablet 0   escitalopram  (LEXAPRO ) 20 MG tablet Take 1 tablet (20 mg total) by mouth daily. 30 tablet 3   escitalopram  (LEXAPRO ) 20 MG tablet Take 1 tablet (20 mg total) by mouth daily. 30 tablet 5   ibuprofen  (ADVIL ) 800 MG tablet Take 1 tablet (800 mg total) by mouth every 8 (eight) hours as needed for moderate pain. 21 tablet 1   ibuprofen  (ADVIL ) 800 MG tablet Take 1 tablet (800 mg total) by mouth 3 (three) times daily as needed for pain and inflammation 90 tablet 0   meloxicam  (MOBIC ) 15 MG tablet Take 1 tablet (15 mg total) by mouth daily as needed for pain and swelling. Do not take as the same time as Ibuprofen  or Celebrex 30 tablet 2   ondansetron  (ZOFRAN -ODT) 4 MG disintegrating tablet Dissolve 1 tablet (4 mg total) by mouth up to 3 (three) times daily as needed for nauses or vomiting. 15 tablet 0   oxyCODONE  (OXY IR/ROXICODONE ) 5 MG immediate release tablet Take 1 tablet (5 mg total) by mouth  every 6-8 (six to eight) hours as needed for severe pain after surgery. 28 tablet 0   predniSONE  (STERAPRED UNI-PAK 48 TAB) 10 MG (48) TBPK tablet Follow package direction. Drink with plenty of water. 48 tablet 0   traZODone  (DESYREL ) 50 MG tablet Take 1 tablet (50 mg total) by mouth at bedtime as needed. 30 tablet 0   No current facility-administered medications on file prior to visit.  [2]  Allergies Allergen Reactions   Tramadol Other (See Comments)    Tremors   "
